# Patient Record
Sex: Male | Born: 1950 | Race: White | Hispanic: No | Smoking: Current every day smoker
Health system: Southern US, Community
[De-identification: ages and names within clinical notes are randomized; demographics above are authoritative.]

## PROBLEM LIST (undated history)

## (undated) DIAGNOSIS — E7800 Pure hypercholesterolemia, unspecified: Secondary | ICD-10-CM

## (undated) DIAGNOSIS — I4891 Unspecified atrial fibrillation: Secondary | ICD-10-CM

## (undated) DIAGNOSIS — I952 Hypotension due to drugs: Secondary | ICD-10-CM

## (undated) DIAGNOSIS — G4730 Sleep apnea, unspecified: Secondary | ICD-10-CM

## (undated) DIAGNOSIS — Z8673 Personal history of transient ischemic attack (TIA), and cerebral infarction without residual deficits: Secondary | ICD-10-CM

## (undated) DIAGNOSIS — J449 Chronic obstructive pulmonary disease, unspecified: Secondary | ICD-10-CM

## (undated) DIAGNOSIS — I110 Hypertensive heart disease with heart failure: Secondary | ICD-10-CM

## (undated) DIAGNOSIS — R7301 Impaired fasting glucose: Secondary | ICD-10-CM

## (undated) DIAGNOSIS — I5022 Chronic systolic (congestive) heart failure: Secondary | ICD-10-CM

## (undated) DIAGNOSIS — F32A Depression, unspecified: Secondary | ICD-10-CM

## (undated) DIAGNOSIS — Z45018 Encounter for adjustment and management of other part of cardiac pacemaker: Secondary | ICD-10-CM

## (undated) DIAGNOSIS — I951 Orthostatic hypotension: Secondary | ICD-10-CM

## (undated) DIAGNOSIS — I482 Chronic atrial fibrillation, unspecified: Secondary | ICD-10-CM

## (undated) DIAGNOSIS — I5032 Chronic diastolic (congestive) heart failure: Secondary | ICD-10-CM

## (undated) DIAGNOSIS — J439 Emphysema, unspecified: Secondary | ICD-10-CM

## (undated) DIAGNOSIS — I10 Essential (primary) hypertension: Secondary | ICD-10-CM

## (undated) DIAGNOSIS — R911 Solitary pulmonary nodule: Secondary | ICD-10-CM

## (undated) DIAGNOSIS — Z7901 Long term (current) use of anticoagulants: Secondary | ICD-10-CM

## (undated) DIAGNOSIS — I219 Acute myocardial infarction, unspecified: Secondary | ICD-10-CM

## (undated) DIAGNOSIS — I25119 Atherosclerotic heart disease of native coronary artery with unspecified angina pectoris: Secondary | ICD-10-CM

## (undated) DIAGNOSIS — F331 Major depressive disorder, recurrent, moderate: Secondary | ICD-10-CM

## (undated) DIAGNOSIS — Z950 Presence of cardiac pacemaker: Secondary | ICD-10-CM

## (undated) DIAGNOSIS — I509 Heart failure, unspecified: Secondary | ICD-10-CM

## (undated) DIAGNOSIS — G40109 Localization-related (focal) (partial) symptomatic epilepsy and epileptic syndromes with simple partial seizures, not intractable, without status epilepticus: Principal | ICD-10-CM

## (undated) DIAGNOSIS — I4819 Other persistent atrial fibrillation: Secondary | ICD-10-CM

## (undated) DIAGNOSIS — I639 Cerebral infarction, unspecified: Secondary | ICD-10-CM

## (undated) DIAGNOSIS — E785 Hyperlipidemia, unspecified: Secondary | ICD-10-CM

## (undated) DIAGNOSIS — F329 Major depressive disorder, single episode, unspecified: Secondary | ICD-10-CM

## (undated) HISTORY — DX: Hyperlipidemia, unspecified: E78.5

## (undated) HISTORY — DX: Orthostatic hypotension: I95.1

## (undated) HISTORY — DX: Chronic systolic (congestive) heart failure: I50.22

## (undated) HISTORY — DX: Major depressive disorder, single episode, unspecified: F32.9

## (undated) HISTORY — PX: PACEMAKER INSERTION: SHX728

## (undated) HISTORY — DX: Depression, unspecified: F32.A

## (undated) HISTORY — DX: Atherosclerotic heart disease of native coronary artery with unspecified angina pectoris: I25.119

## (undated) HISTORY — DX: Major depressive disorder, recurrent, moderate: F33.1

## (undated) HISTORY — DX: Sleep apnea, unspecified: G47.30

## (undated) HISTORY — DX: Emphysema, unspecified: J43.9

## (undated) HISTORY — DX: Chronic atrial fibrillation, unspecified: I48.20

## (undated) HISTORY — DX: Personal history of transient ischemic attack (TIA), and cerebral infarction without residual deficits: Z86.73

## (undated) HISTORY — DX: Hypertensive heart disease with heart failure: I11.0

## (undated) HISTORY — DX: Encounter for adjustment and management of other part of cardiac pacemaker: Z45.018

## (undated) HISTORY — DX: Localization-related (focal) (partial) symptomatic epilepsy and epileptic syndromes with simple partial seizures, not intractable, without status epilepticus: G40.109

## (undated) HISTORY — DX: Long term (current) use of anticoagulants: Z79.01

## (undated) HISTORY — DX: Pure hypercholesterolemia, unspecified: E78.00

## (undated) HISTORY — DX: Solitary pulmonary nodule: R91.1

## (undated) HISTORY — DX: Hypotension due to drugs: I95.2

## (undated) HISTORY — DX: Chronic diastolic (congestive) heart failure: I50.32

## (undated) HISTORY — DX: Essential (primary) hypertension: I10

## (undated) HISTORY — DX: Impaired fasting glucose: R73.01

## (undated) HISTORY — DX: Other persistent atrial fibrillation: I48.19

---

## 2016-04-17 DIAGNOSIS — Z7901 Long term (current) use of anticoagulants: Secondary | ICD-10-CM

## 2016-04-17 DIAGNOSIS — E785 Hyperlipidemia, unspecified: Secondary | ICD-10-CM

## 2016-04-17 DIAGNOSIS — I482 Chronic atrial fibrillation, unspecified: Secondary | ICD-10-CM | POA: Insufficient documentation

## 2016-04-17 DIAGNOSIS — I110 Hypertensive heart disease with heart failure: Secondary | ICD-10-CM

## 2016-04-17 DIAGNOSIS — I5032 Chronic diastolic (congestive) heart failure: Secondary | ICD-10-CM

## 2016-04-17 HISTORY — DX: Hypertensive heart disease with heart failure: I11.0

## 2016-04-17 HISTORY — DX: Hyperlipidemia, unspecified: E78.5

## 2016-04-17 HISTORY — DX: Long term (current) use of anticoagulants: Z79.01

## 2016-04-17 HISTORY — DX: Chronic diastolic (congestive) heart failure: I50.32

## 2016-04-17 HISTORY — DX: Chronic atrial fibrillation, unspecified: I48.20

## 2016-08-24 ENCOUNTER — Other Ambulatory Visit (HOSPITAL_BASED_OUTPATIENT_CLINIC_OR_DEPARTMENT_OTHER): Payer: Self-pay

## 2016-08-24 DIAGNOSIS — R0683 Snoring: Secondary | ICD-10-CM

## 2016-08-24 DIAGNOSIS — G4730 Sleep apnea, unspecified: Secondary | ICD-10-CM

## 2016-08-24 DIAGNOSIS — G4710 Hypersomnia, unspecified: Secondary | ICD-10-CM

## 2016-09-17 ENCOUNTER — Emergency Department (HOSPITAL_COMMUNITY): Payer: Medicaid Other

## 2016-09-17 ENCOUNTER — Encounter (HOSPITAL_COMMUNITY): Payer: Self-pay | Admitting: Emergency Medicine

## 2016-09-17 ENCOUNTER — Emergency Department (HOSPITAL_COMMUNITY)
Admission: EM | Admit: 2016-09-17 | Discharge: 2016-09-17 | Disposition: A | Discharge status: Home / Self Care | Payer: Medicaid Other | Attending: Physician Assistant | Admitting: Physician Assistant

## 2016-09-17 DIAGNOSIS — F17290 Nicotine dependence, other tobacco product, uncomplicated: Secondary | ICD-10-CM | POA: Insufficient documentation

## 2016-09-17 DIAGNOSIS — J449 Chronic obstructive pulmonary disease, unspecified: Secondary | ICD-10-CM | POA: Insufficient documentation

## 2016-09-17 DIAGNOSIS — I252 Old myocardial infarction: Secondary | ICD-10-CM | POA: Diagnosis not present

## 2016-09-17 DIAGNOSIS — I110 Hypertensive heart disease with heart failure: Secondary | ICD-10-CM | POA: Diagnosis not present

## 2016-09-17 DIAGNOSIS — Z8673 Personal history of transient ischemic attack (TIA), and cerebral infarction without residual deficits: Secondary | ICD-10-CM | POA: Diagnosis not present

## 2016-09-17 DIAGNOSIS — I509 Heart failure, unspecified: Secondary | ICD-10-CM | POA: Insufficient documentation

## 2016-09-17 DIAGNOSIS — Z950 Presence of cardiac pacemaker: Secondary | ICD-10-CM | POA: Diagnosis not present

## 2016-09-17 DIAGNOSIS — R5383 Other fatigue: Secondary | ICD-10-CM | POA: Insufficient documentation

## 2016-09-17 DIAGNOSIS — R42 Dizziness and giddiness: Secondary | ICD-10-CM | POA: Diagnosis not present

## 2016-09-17 DIAGNOSIS — R531 Weakness: Secondary | ICD-10-CM | POA: Diagnosis present

## 2016-09-17 HISTORY — DX: Essential (primary) hypertension: I10

## 2016-09-17 HISTORY — DX: Cerebral infarction, unspecified: I63.9

## 2016-09-17 HISTORY — DX: Chronic obstructive pulmonary disease, unspecified: J44.9

## 2016-09-17 HISTORY — DX: Presence of cardiac pacemaker: Z95.0

## 2016-09-17 HISTORY — DX: Heart failure, unspecified: I50.9

## 2016-09-17 HISTORY — DX: Acute myocardial infarction, unspecified: I21.9

## 2016-09-17 HISTORY — DX: Unspecified atrial fibrillation: I48.91

## 2016-09-17 LAB — BASIC METABOLIC PANEL
Anion gap: 12 (ref 5–15)
BUN: 22 mg/dL — AB (ref 6–20)
CALCIUM: 8.6 mg/dL — AB (ref 8.9–10.3)
CO2: 23 mmol/L (ref 22–32)
CREATININE: 1.15 mg/dL (ref 0.61–1.24)
Chloride: 104 mmol/L (ref 101–111)
GFR calc non Af Amer: >60 mL/min (ref >60)
Glucose, Bld: 70 mg/dL (ref 65–99)
Potassium: 3.9 mmol/L (ref 3.5–5.1)
SODIUM: 139 mmol/L (ref 135–145)

## 2016-09-17 LAB — URINALYSIS, ROUTINE W REFLEX MICROSCOPIC
Bilirubin Urine: NEGATIVE
Glucose, UA: NEGATIVE mg/dL
HGB URINE DIPSTICK: NEGATIVE
KETONES UR: NEGATIVE mg/dL
Leukocytes, UA: NEGATIVE
Nitrite: NEGATIVE
PROTEIN: NEGATIVE mg/dL
Specific Gravity, Urine: 1.008 (ref 1.005–1.030)
pH: 6 (ref 5.0–8.0)

## 2016-09-17 LAB — CBC
HCT: 40 % (ref 39.0–52.0)
Hemoglobin: 14.5 g/dL (ref 13.0–17.0)
MCH: 35.1 pg — AB (ref 26.0–34.0)
MCHC: 36.3 g/dL — ABNORMAL HIGH (ref 30.0–36.0)
MCV: 96.9 fL (ref 78.0–100.0)
PLATELETS: 195 10*3/uL (ref 150–400)
RBC: 4.13 MIL/uL — AB (ref 4.22–5.81)
RDW: 14.9 % (ref 11.5–15.5)
WBC: 9.1 10*3/uL (ref 4.0–10.5)

## 2016-09-17 LAB — BRAIN NATRIURETIC PEPTIDE: B Natriuretic Peptide: 220.5 pg/mL — ABNORMAL HIGH (ref 0.0–100.0)

## 2016-09-17 LAB — CBG MONITORING, ED: Glucose-Capillary: 87 mg/dL (ref 65–99)

## 2016-09-17 LAB — I-STAT TROPONIN, ED: TROPONIN I, POC: 0 ng/mL (ref 0.00–0.08)

## 2016-09-17 MED ORDER — IBUPROFEN 800 MG PO TABS
800.0000 mg | ORAL_TABLET | Freq: Once | ORAL | Status: AC
  Administered 2016-09-17: 800 mg via ORAL
  Filled 2016-09-17: qty 1

## 2016-09-17 NOTE — ED Triage Notes (Signed)
Pt c/o generalized symmetrical weakness, generalized blurred vision, and dizziness onset yesterday. Pt reports episodes onset in June, possibly earlier, where he gets weak in his legs, myoclonic jerks to arms and legs, worse on right side, pt unable to move during episodes. Neurologist states that episodes are "some sort of tremor," but not seizures. Episodes are occurring more frequently and with higher intensity. Last week pt's cardizem dose was decreased due to hypotension. Hx right side nerve damage s/t CVAs.

## 2016-09-17 NOTE — ED Notes (Signed)
Pt is aware that a urine sample is needed but is unable to provide one at this time 

## 2016-09-17 NOTE — ED Notes (Signed)
Daughter in law came out to nurse's desk stating that she wants documented in patient's chart that he has had opioid addiction in past and doesn't want any narcotic pain meds given.

## 2016-09-17 NOTE — Discharge Instructions (Signed)
Your labs are all very reassuring today. Your CAT scan shows an old stroke. This is consistent with your history. Your chest x-ray does not show any fluid on yhour lung. Please follow-up with your primary care physician, your cardiologist, and your neurologist.

## 2016-09-17 NOTE — ED Notes (Signed)
emt attempted to redraw lavender but was unable to collect anything. Nurse has been notified

## 2016-09-17 NOTE — ED Notes (Signed)
Patient requesting something for pain.  Patient states, "Tylenol doesn't work for me so needs to be something like Percocet".   Made Dr Thomasene Lot aware of DIL comment and patient's request.  Verbal orders for Ibuprofen '800mg'$  PO given.

## 2016-09-17 NOTE — ED Provider Notes (Signed)
[facility] DEPT Provider Note   CSN: [id number] Arrival date & time: 09/17/16  1027     History   Chief Complaint Chief Complaint  Patient presents with  . Dizziness  . Weakness    HPI Aaron Peterson [name]. is a 65 y.o. male.  HPI   She is a 65 year old male brought in by his family for "not feeling right". Patient and family have trouble lab reading. However they state that sometimes when he says he "doesn't feel well" it means that he is sick. Patient has no records here. He's been seen at [facility] multiple times for his A. fib, neurologic disease, CHF, kidney injury.   Family reports that he has strange "episodes". For which he seen by a neurologist. He reports they're more frequently than normal.  Patient's had strokelike symptoms in the past but unable to get an MRI secondary to pacemaker. He is also followed by a neurologist for this.  Patient is closely followed by a cardiologist for CHF and A. fib. Patient family says that he was having episodes of hypotension so he had his Cardizem decreased last week.  Patient's family reports that he felt like he was getting edema is greatly increase his Lasix, terazosin third day. They're concerned because sometimes in the increased Lasix he has trouble with his kidneys. We have no prior kidney function labs to compare to proceed All this care at [facility].   Patient is a poor historian and poor at giving symptoms. He may feel little lightheadedness may have blurry vision but is unsure when they started and can't describe it further.  Past Medical History:  Diagnosis Date  . A-fib ([place])   . CHF (congestive heart failure) ([place])   . COPD (chronic obstructive pulmonary disease) ([place])   . Hypertension   . Myocardial infarction   . Pacemaker   . Stroke [facility])     There are no active problems to display for this patient.   Past Surgical History:  Procedure Laterality Date  . PACEMAKER INSERTION         Home  Medications    Prior to Admission medications   Not on File    Family History History reviewed. No pertinent family history.  Social History Social History  Substance Use Topics  . Smoking status: Current Every Day Smoker    Types: E-cigarettes  . Smokeless tobacco: Former Systems developer  . Alcohol use No     Allergies   Penicillins   Review of Systems Review of Systems  Constitutional: Positive for fatigue. Negative for activity change.  Respiratory: Negative for shortness of breath.   Cardiovascular: Negative for chest pain.  Gastrointestinal: Negative for abdominal pain.  Neurological: Positive for weakness.  All other systems reviewed and are negative.    Physical Exam Updated Vital Signs BP 103/87   Pulse 99   Temp 97.9 F (36.6 C) (Oral)   Resp 19   Ht '5\' 5"'$  (1.651 m)   Wt 150 lb (68 kg)   SpO2 94%   BMI 24.96 kg/m   Physical Exam  Constitutional: He appears well-developed and well-nourished.  HENT:  Head: Normocephalic and atraumatic.  Eyes: Conjunctivae and EOM are normal. Pupils are equal, round, and reactive to light.  Neck: Neck supple.  Cardiovascular:  No murmur heard. A. fib  Pulmonary/Chest: Effort normal and breath sounds normal. No respiratory distress.  Abdominal: Soft. There is no tenderness.  Musculoskeletal: He exhibits no edema.  Neurological: He is alert. No cranial nerve deficit.  He exhibits normal muscle tone. Coordination normal.  She has baseline slurred speech, edentulous. Finger to nose intact. Cranial nerve II through XII appear relatively in tack.  Provider drift negative. Equal strength bilateral upper and lower extremities  Skin: Skin is warm and dry.  Psychiatric: He has a normal mood and affect.  Nursing note and vitals reviewed.    ED Treatments / Results  Labs (all labs ordered are listed, but only abnormal results are displayed) Labs Reviewed  BASIC METABOLIC PANEL - Abnormal; Notable for the following:       Result  Value   BUN 22 (*)    Calcium 8.6 (*)    All other components within normal limits  CBC - Abnormal; Notable for the following:    RBC 4.13 (*)    MCH 35.1 (*)    MCHC 36.3 (*)    All other components within normal limits  BRAIN NATRIURETIC PEPTIDE - Abnormal; Notable for the following:    B Natriuretic Peptide 220.5 (*)    All other components within normal limits  URINALYSIS, ROUTINE W REFLEX MICROSCOPIC  CBG MONITORING, ED  I-STAT TROPOININ, ED    EKG  EKG Interpretation  Date/Time:  Thursday September 17 2016 10:44:26 EST Ventricular Rate:  120 PR Interval:    QRS Duration: 99 QT Interval:  337 QTC Calculation: 498 R Axis:   79 Text Interpretation:  Sinus tachycardia with irregular rate Low voltage, precordial leads Borderline repolarization abnormality Borderline prolonged QT interval no evidence of acute ischemia, a fib Confirmed by Gerald Leitz ([id number]) on 09/17/2016 12:17:18 PM       Radiology No results found.  Procedures Procedures (including critical care time)  Medications Ordered in ED Medications  ibuprofen (ADVIL,MOTRIN) tablet 800 mg (800 mg Oral Given 09/17/16 1408)     Initial Impression / Assessment and Plan / ED Course  I have reviewed the triage vital signs and the nursing notes.  Pertinent labs & imaging results that were available during my care of the patient were reviewed by me and considered in my medical decision making (see chart for details).  Clinical Course     Patient is a chronically ill 65 year old male presenting with feeling "not well". This is in the context of recent increase in patient's Lasix for what the family perceives as increased edema. On exam patient is mildly clinically dry, A. fib, clear lungs, nonfocal neurologic exam. We will get screening labs, urine, chest x-ray, CT head. Patient complains of some possible blurry vision versus dizziness vs lightheadedness. Patient has pacemaker is unable to get MRI. We will get  CT here to make sure he doesn't have bleed. Otherwise patient has medically managed by outpatient neurologist for previous stroke-like it symptoms. Patient has nonfocal neurologic exam.  Patient recently moved in with these family members from previous family.  This family has been trying to adjust medications/get patient on the right track.  Has had a problem twice where they over diuresed causing AKI. That is what they are concenrned about today.  Patient appears well, and they think he is baseline.    Patient family member came and informed nursing he had previous issue with opioid addiction.  When family members left, patient complained on pain stating tylenol doesn't work, ibuprofen given.  Labs, vitals nad physical exam all appear baseline.  Will have him follow up with his PCP, neurologist and cardiolgist.  Patient is comfortable, ambulatory, and taking PO at time of discharge.  Patient expressed understanding about  return precautions.    Final Clinical Impressions(s) / ED Diagnoses   Final diagnoses:  Fatigue, unspecified type    New Prescriptions There are no discharge medications for this patient.    Alise Calais Julio Alm, MD 09/20/16 0177

## 2016-09-17 NOTE — ED Notes (Signed)
Patient transported to CT 

## 2016-10-15 ENCOUNTER — Ambulatory Visit (HOSPITAL_BASED_OUTPATIENT_CLINIC_OR_DEPARTMENT_OTHER): Payer: Medicaid Other | Attending: Physician Assistant | Admitting: Internal Medicine

## 2016-10-15 VITALS — Ht 66.0 in | Wt 150.0 lb

## 2016-10-15 DIAGNOSIS — G4733 Obstructive sleep apnea (adult) (pediatric): Secondary | ICD-10-CM | POA: Insufficient documentation

## 2016-10-15 DIAGNOSIS — G4736 Sleep related hypoventilation in conditions classified elsewhere: Secondary | ICD-10-CM | POA: Diagnosis not present

## 2016-10-15 DIAGNOSIS — I493 Ventricular premature depolarization: Secondary | ICD-10-CM | POA: Insufficient documentation

## 2016-10-15 DIAGNOSIS — G4730 Sleep apnea, unspecified: Secondary | ICD-10-CM

## 2016-10-15 DIAGNOSIS — I4891 Unspecified atrial fibrillation: Secondary | ICD-10-CM | POA: Diagnosis not present

## 2016-10-15 DIAGNOSIS — R0683 Snoring: Secondary | ICD-10-CM | POA: Diagnosis not present

## 2016-10-15 DIAGNOSIS — G4710 Hypersomnia, unspecified: Secondary | ICD-10-CM

## 2016-10-18 DIAGNOSIS — Z7901 Long term (current) use of anticoagulants: Secondary | ICD-10-CM

## 2016-10-18 DIAGNOSIS — I25119 Atherosclerotic heart disease of native coronary artery with unspecified angina pectoris: Secondary | ICD-10-CM

## 2016-10-18 DIAGNOSIS — F331 Major depressive disorder, recurrent, moderate: Secondary | ICD-10-CM

## 2016-10-18 DIAGNOSIS — I952 Hypotension due to drugs: Secondary | ICD-10-CM

## 2016-10-18 DIAGNOSIS — R7301 Impaired fasting glucose: Secondary | ICD-10-CM

## 2016-10-18 DIAGNOSIS — Z8673 Personal history of transient ischemic attack (TIA), and cerebral infarction without residual deficits: Secondary | ICD-10-CM

## 2016-10-18 DIAGNOSIS — R911 Solitary pulmonary nodule: Secondary | ICD-10-CM

## 2016-10-18 HISTORY — DX: Hypotension due to drugs: I95.2

## 2016-10-18 HISTORY — DX: Major depressive disorder, recurrent, moderate: F33.1

## 2016-10-18 HISTORY — DX: Long term (current) use of anticoagulants: Z79.01

## 2016-10-18 HISTORY — DX: Personal history of transient ischemic attack (TIA), and cerebral infarction without residual deficits: Z86.73

## 2016-10-18 HISTORY — DX: Impaired fasting glucose: R73.01

## 2016-10-18 HISTORY — DX: Atherosclerotic heart disease of native coronary artery with unspecified angina pectoris: I25.119

## 2016-10-18 HISTORY — DX: Solitary pulmonary nodule: R91.1

## 2016-10-24 DIAGNOSIS — G4730 Sleep apnea, unspecified: Secondary | ICD-10-CM

## 2016-10-24 NOTE — Procedures (Signed)
  Patient Name: Aaron Peterson, Aaron Peterson Date: 10/15/2016 Gender: Male D.O.B: 1951-07-05 Age (years): 65 Referring Provider: Cyndi Bender Height (inches): 46 Interpreting Physician: Baird Lyons MD, ABSM Weight (lbs): 150 RPSGT: Peak, Robert BMI: 24 MRN: [id number] Neck Size: 15.00 CLINICAL INFORMATION Sleep Study Type: NPSG  Indication for sleep study: Snoring  Epworth Sleepiness Score: 19  SLEEP STUDY TECHNIQUE As per the AASM Manual for the Scoring of Sleep and Associated Events v2.3 (April 2016) with a hypopnea requiring 4% desaturations.  The channels recorded and monitored were frontal, central and occipital EEG, electrooculogram (EOG), submentalis EMG (chin), nasal and oral airflow, thoracic and abdominal wall motion, anterior tibialis EMG, snore microphone, electrocardiogram, and pulse oximetry.  MEDICATIONS Medications self-administered by patient taken the night of the study : none reported  SLEEP ARCHITECTURE The study was initiated at 9:32:36 PM and ended at 4:49:20 AM.  Sleep onset time was 3.4 minutes and the sleep efficiency was 84.1%. The total sleep time was 367.5 minutes.  Stage REM latency was 141.5 minutes.  The patient spent 23.27% of the night in stage N1 sleep, 60.54% in stage N2 sleep, 0.00% in stage N3 and 16.19% in REM.  Alpha intrusion was absent.  Supine sleep was 35.10%.  RESPIRATORY PARAMETERS The overall apnea/hypopnea index (AHI) was 8.5 per hour. There were 22 total apneas, including 14 obstructive, 2 central and 6 mixed apneas. There were 30 hypopneas and 2 RERAs.  The AHI during Stage REM sleep was 0.0 per hour.  AHI while supine was 16.7 per hour.  The mean oxygen saturation was 86.56%. The minimum SpO2 during sleep was 74.00%.  Loud snoring was noted during this study.  CARDIAC DATA The 2 lead EKG demonstrated atrial fibrillation. The mean heart rate was 83.82 beats per minute. Other EKG findings include: PVCs.  LEG  MOVEMENT DATA The total PLMS were 11 with a resulting PLMS index of 1.80. Associated arousal with leg movement index was 0.2 .  IMPRESSIONS - Mild obstructive sleep apnea occurred during this study (AHI = 8.5/h). - No significant central sleep apnea occurred during this study (CAI = 0.3/h). - Significant oxygen desaturation was noted during this study (Min O2 = 74.00%, Mean saturation  86.6% ). - The patient snored with Loud snoring volume. - EKG findings include PVCs. - Clinically significant periodic limb movements did not occur during sleep. No significant associated arousals.  DIAGNOSIS - Obstructive Sleep Apnea (327.23 [G47.33 ICD-10]) - Nocturnal Hypoxemia (327.26 [G47.36 ICD-10])  RECOMMENDATIONS - Consider CPAP or a fitted oral appliance to treat obstructive sleep apnea. - The mean oxygen saturation was lower than anticipated for this degree of OSA, suggesting underlying cardiopulmonary disease which may need to be addressed. - Positional therapy avoiding supine position during sleep. - Avoid alcohol, sedatives and other CNS depressants that may worsen sleep apnea and disrupt normal sleep architecture. - Sleep hygiene should be reviewed to assess factors that may improve sleep quality. - Weight management and regular exercise should be initiated or continued if appropriate.  [Electronically signed] 10/24/2016 11:47 AM  Baird Lyons MD, ABSM Diplomate, American Board of Sleep Medicine   NPI: [id number]  [place], American Board of Sleep Medicine  ELECTRONICALLY SIGNED ON:  10/24/2016, 11:42 AM [place] PH: [phone]   FX: [phone] [place]

## 2016-11-12 ENCOUNTER — Other Ambulatory Visit (HOSPITAL_BASED_OUTPATIENT_CLINIC_OR_DEPARTMENT_OTHER): Payer: Self-pay

## 2016-11-12 DIAGNOSIS — G4710 Hypersomnia, unspecified: Secondary | ICD-10-CM

## 2016-11-12 DIAGNOSIS — R0683 Snoring: Secondary | ICD-10-CM

## 2016-11-12 DIAGNOSIS — G4730 Sleep apnea, unspecified: Secondary | ICD-10-CM

## 2016-12-02 ENCOUNTER — Encounter: Payer: Self-pay | Admitting: Sports Medicine

## 2016-12-02 ENCOUNTER — Ambulatory Visit (INDEPENDENT_AMBULATORY_CARE_PROVIDER_SITE_OTHER): Payer: Medicaid Other | Admitting: Sports Medicine

## 2016-12-02 DIAGNOSIS — I5022 Chronic systolic (congestive) heart failure: Secondary | ICD-10-CM

## 2016-12-02 DIAGNOSIS — B351 Tinea unguium: Secondary | ICD-10-CM

## 2016-12-02 DIAGNOSIS — M79675 Pain in left toe(s): Secondary | ICD-10-CM

## 2016-12-02 DIAGNOSIS — J439 Emphysema, unspecified: Secondary | ICD-10-CM

## 2016-12-02 DIAGNOSIS — M79674 Pain in right toe(s): Secondary | ICD-10-CM | POA: Diagnosis not present

## 2016-12-02 DIAGNOSIS — I10 Essential (primary) hypertension: Secondary | ICD-10-CM

## 2016-12-02 DIAGNOSIS — E7800 Pure hypercholesterolemia, unspecified: Secondary | ICD-10-CM | POA: Insufficient documentation

## 2016-12-02 DIAGNOSIS — I4819 Other persistent atrial fibrillation: Secondary | ICD-10-CM | POA: Insufficient documentation

## 2016-12-02 DIAGNOSIS — Z950 Presence of cardiac pacemaker: Secondary | ICD-10-CM | POA: Insufficient documentation

## 2016-12-02 HISTORY — DX: Essential (primary) hypertension: I10

## 2016-12-02 HISTORY — DX: Chronic systolic (congestive) heart failure: I50.22

## 2016-12-02 HISTORY — DX: Other persistent atrial fibrillation: I48.19

## 2016-12-02 HISTORY — DX: Emphysema, unspecified: J43.9

## 2016-12-02 NOTE — Progress Notes (Signed)
Subjective: Aaron Peterson. is a 66 y.o. male patient seen today in office with complaint of painful thickened and elongated toenails; unable to trim. Patient denies history of Diabetes, Neuropathy, or Vascular disease. On Oxygen for Emphysema and COPD. Patient has no other pedal complaints at this time.   Patient Active Problem List   Diagnosis Date Noted  . Chronic systolic congestive heart failure ([place]) 12/02/2016  . Emphysema lung ([place]) 12/02/2016  . Essential hypertension 12/02/2016  . Hypercholesterolemia 12/02/2016  . Pacemaker 12/02/2016  . Persistent atrial fibrillation ([place]) 12/02/2016  . Coronary artery disease involving native coronary artery of native heart with angina pectoris ([place]) 10/18/2016  . Current use of long term anticoagulation 10/18/2016  . Hx of completed stroke 10/18/2016  . Hypotension due to drugs 10/18/2016  . IFG (impaired fasting glucose) 10/18/2016  . Moderate episode of recurrent major depressive disorder ([place]) 10/18/2016  . Pulmonary nodule, left 10/18/2016  . Chronic anticoagulation 04/17/2016  . Chronic atrial fibrillation ([place]) 04/17/2016  . Chronic diastolic heart failure ([place]) 04/17/2016  . Hyperlipidemia 04/17/2016  . Hypertensive heart disease with heart failure ([place]) 04/17/2016    No current outpatient prescriptions on file prior to visit.   No current facility-administered medications on file prior to visit.     Allergies  Allergen Reactions  . Penicillins Swelling    Oral edema    Objective: Physical Exam  General: Well developed, nourished, no acute distress, awake, alert and oriented x 3  Vascular: Dorsalis pedis artery 1/4 bilateral, Posterior tibial artery 1/4 bilateral, skin temperature warm to warm proximal to distal bilateral lower extremities, mild varicosities, scant pedal hair present bilateral. Trace edema bilateral.   Neurological: Gross sensation present via light touch bilateral.   Dermatological: Skin is  warm, dry, and supple bilateral, Nails 1-10 are tender, long, thick, and discolored with mild subungal debris with left 1st toenail most involved, no webspace macerations present bilateral, no open lesions present bilateral, no callus/corns/hyperkeratotic tissue present bilateral. No signs of infection bilateral.  Musculoskeletal: No symptomatic boney deformities noted bilateral. Muscular strength within normal limits without painon range of motion. No pain with calf compression bilateral.  Assessment and Plan:  Problem List Items Addressed This Visit      Respiratory   Emphysema lung (HCC)   Relevant Medications   fluticasone-salmeterol (ADVAIR HFA) 230-21 MCG/ACT inhaler   albuterol (PROVENTIL) (2.5 MG/3ML) 0.083% nebulizer solution   Albuterol Sulfate (PROAIR HFA IN)   fluticasone-salmeterol (ADVAIR HFA) 115-21 MCG/ACT inhaler   ipratropium-albuterol (DUONEB) 0.5-2.5 (3) MG/3ML SOLN    Other Visit Diagnoses    Dermatophytosis of nail    -  Primary   Toe pain, bilateral          -Examined patient.  -Discussed treatment options for painful mycotic nails. -Mechanically debrided and reduced mycotic nails with sterile nail nipper and dremel nail file without incident. -Patient to return in 3 months for follow up evaluation or sooner if symptoms worsen.  Landis Martins, DPM

## 2016-12-09 ENCOUNTER — Ambulatory Visit (HOSPITAL_BASED_OUTPATIENT_CLINIC_OR_DEPARTMENT_OTHER): Payer: Medicaid Other | Attending: Physician Assistant | Admitting: Internal Medicine

## 2016-12-09 DIAGNOSIS — I4891 Unspecified atrial fibrillation: Secondary | ICD-10-CM | POA: Insufficient documentation

## 2016-12-09 DIAGNOSIS — G4710 Hypersomnia, unspecified: Secondary | ICD-10-CM | POA: Diagnosis not present

## 2016-12-09 DIAGNOSIS — R0683 Snoring: Secondary | ICD-10-CM | POA: Diagnosis not present

## 2016-12-09 DIAGNOSIS — G4733 Obstructive sleep apnea (adult) (pediatric): Secondary | ICD-10-CM | POA: Insufficient documentation

## 2016-12-09 DIAGNOSIS — G4730 Sleep apnea, unspecified: Secondary | ICD-10-CM

## 2016-12-13 DIAGNOSIS — G4730 Sleep apnea, unspecified: Secondary | ICD-10-CM | POA: Diagnosis not present

## 2016-12-13 NOTE — Procedures (Signed)
Patient Name: Aaron Peterson, Rockett Date: 12/09/2016 Gender: Male D.O.B: [birth year] Age (years): 65 Referring Provider: Cyndi Bender Height (inches): 9 Interpreting Physician: Baird Lyons MD, ABSM Weight (lbs): 160 RPSGT: Carolin Coy BMI: 26 MRN: [id number] Neck Size: 15.50 CLINICAL INFORMATION The patient is referred for a split night study with BPAP. Most recent polysomnogram dated 10/15/2016 revealed an AHI of 8.5/h and RDI of 8.8/h. Desaturation to 86%, body weight 150 lbs  MEDICATIONS Medications self-administered by patient taken the night of the study : none reported  SLEEP STUDY TECHNIQUE As per the AASM Manual for the Scoring of Sleep and Associated Events v2.3 (April 2016) with a hypopnea requiring 4% desaturations.  The channels recorded and monitored were frontal, central and occipital EEG, electrooculogram (EOG), submentalis EMG (chin), nasal and oral airflow, thoracic and abdominal wall motion, anterior tibialis EMG, snore microphone, electrocardiogram, and pulse oximetry. Bi-level positive airway pressure (BiPAP) was initiated when the patient met split night criteria and was titrated according to treat sleep-disordered breathing.  RESPIRATORY PARAMETERS Diagnostic  Total AHI (/hr): 10.6 RDI (/hr): 12.5 OA Index (/hr): 0.7 CA Index (/hr): 0.3 REM AHI (/hr): 1.2 NREM AHI (/hr): 11.9 Supine AHI (/hr): 12.9 Non-supine AHI (/hr): 9.73 Min O2 Sat (%): 79.00 Mean O2 (%): 89.36 Time below 88% (min): 92.9   Titration  Optimal IPAP Pressure (cm): 22 Optimal EPAP Pressure (cm): 17 AHI at Optimal Pressure (/hr): 3.4 Min O2 at Optimal Pressure (%): 89.0 Sleep % at Optimal (%): 82 Supine % at Optimal (%): 1      SLEEP ARCHITECTURE The study was initiated at 9:39:07 PM and terminated at 4:35:19 AM. The total recorded time was 416.2 minutes. EEG confirmed total sleep time was 403.0 minutes yielding a sleep efficiency of 96.8%. Sleep onset after lights out was 0.5  minutes with a REM latency of 191.5 minutes. The patient spent 20.47% of the night in stage N1 sleep, 67.00% in stage N2 sleep, 0.00% in stage N3 and 12.53% in REM. Wake after sleep onset (WASO) was 12.7 minutes. The Arousal Index was 13.8/hour.  LEG MOVEMENT DATA The total Periodic Limb Movements of Sleep (PLMS) were 4. The PLMS index was 0.60 .  CARDIAC DATA The 2 lead EKG demonstrated atrial fibrillation. The mean heart rate was 100.18 beats per minute. Other EKG findings include: None.  IMPRESSIONS - Mild obstructive sleep apnea occurred during the diagnostic portion of the study (AHI = 10.6 /hour). - CPAP titration provided inadequate control. CPAP 18 controlled apneas. An optimal BiPAP pressure was selected for this patient ( 22 / 17cm of water) to control residual snoring. - No significant central sleep apnea occurred during the diagnostic portion of the study (CAI = 0.3/hour). - The patient snored with Moderate snoring volume during the diagnostic portion of the study. - No cardiac abnormalities were noted during this study. - Clinically significant periodic limb movements of sleep did not occur during the study.  DIAGNOSIS - Obstructive Sleep Apnea (327.23 [G47.33 ICD-10])  RECOMMENDATIONS - Trial of BiPAP therapy on 22/17 cm H2O with a Medium size Resmed Full Face Mask Mirage Quattro mask and heated humidification. - Avoid alcohol, sedatives and other CNS depressants that may worsen sleep apnea and disrupt normal sleep architecture. - Sleep hygiene should be reviewed to assess factors that may improve sleep quality. - Weight management and regular exercise should be initiated or continued.  [Electronically signed] 12/13/2016 11:24 AM  Baird Lyons MD, [place], American Board of Sleep Medicine   NPI: [id number]  Deneise Lever Diplomate, American Board of Sleep Medicine  ELECTRONICALLY SIGNED ON:  12/13/2016, 11:18 AM [place] PH: [phone]  [phone]   FX: [phone] [place]

## 2017-01-14 DIAGNOSIS — Z45018 Encounter for adjustment and management of other part of cardiac pacemaker: Secondary | ICD-10-CM

## 2017-01-14 HISTORY — DX: Encounter for adjustment and management of other part of cardiac pacemaker: Z45.018

## 2017-03-03 ENCOUNTER — Ambulatory Visit: Payer: Medicaid Other | Admitting: Sports Medicine

## 2017-03-03 DIAGNOSIS — B351 Tinea unguium: Secondary | ICD-10-CM

## 2017-03-03 DIAGNOSIS — M79676 Pain in unspecified toe(s): Secondary | ICD-10-CM | POA: Diagnosis not present

## 2017-03-03 DIAGNOSIS — M79674 Pain in right toe(s): Secondary | ICD-10-CM

## 2017-03-03 DIAGNOSIS — M79675 Pain in left toe(s): Secondary | ICD-10-CM

## 2017-03-03 DIAGNOSIS — I739 Peripheral vascular disease, unspecified: Secondary | ICD-10-CM

## 2017-03-03 NOTE — Progress Notes (Signed)
Subjective: Aaron Peterson. is a 66 y.o. male patient seen today in office with complaint of painful thickened and elongated toenails; unable to trim. Patient denies history of Diabetes, Neuropathy, or Vascular disease. On Oxygen for Emphysema and COPD however patient reports that he left his O2 at home today. Patient has no other pedal complaints at this time.   Patient Active Problem List   Diagnosis Date Noted  . Chronic systolic congestive heart failure ([place]) 12/02/2016  . Emphysema lung ([place]) 12/02/2016  . Essential hypertension 12/02/2016  . Hypercholesterolemia 12/02/2016  . Pacemaker 12/02/2016  . Persistent atrial fibrillation ([place]) 12/02/2016  . Coronary artery disease involving native coronary artery of native heart with angina pectoris ([place]) 10/18/2016  . Current use of long term anticoagulation 10/18/2016  . Hx of completed stroke 10/18/2016  . Hypotension due to drugs 10/18/2016  . IFG (impaired fasting glucose) 10/18/2016  . Moderate episode of recurrent major depressive disorder ([place]) 10/18/2016  . Pulmonary nodule, left 10/18/2016  . Chronic anticoagulation 04/17/2016  . Chronic atrial fibrillation ([place]) 04/17/2016  . Chronic diastolic heart failure ([place]) 04/17/2016  . Hyperlipidemia 04/17/2016  . Hypertensive heart disease with heart failure ([place]) 04/17/2016    Current Outpatient Prescriptions on File Prior to Visit  Medication Sig Dispense Refill  . albuterol (PROVENTIL) (2.5 MG/3ML) 0.083% nebulizer solution 2.5 mg.    . Albuterol Sulfate (PROAIR HFA IN) Inhale into the lungs.    Marland Kitchen diltiazem (CARDIZEM) 60 MG tablet Take 60 mg by mouth.    . fluticasone-salmeterol (ADVAIR HFA) 115-21 MCG/ACT inhaler Inhale into the lungs.    . fluticasone-salmeterol (ADVAIR HFA) 230-21 MCG/ACT inhaler INHALE 2 PUFFS BY MOUTH TWICE A DAY RINSE MOUTH AND THOART AFTER USE    . furosemide (LASIX) 20 MG tablet Take 60 mg by mouth.    Marland Kitchen ipratropium-albuterol (DUONEB) 0.5-2.5 (3)  MG/3ML SOLN Take 1 ampule by nebulization every 6 (six) hours as needed.    Marland Kitchen lisinopril (PRINIVIL,ZESTRIL) 5 MG tablet Take 5 mg by mouth.    . metoprolol tartrate (LOPRESSOR) 25 MG tablet Take 25 mg by mouth.    . mirtazapine (REMERON) 15 MG tablet Take 22.5 mg by mouth.    . nitroGLYCERIN (NITROSTAT) 0.4 MG SL tablet Place under the tongue.    . pravastatin (PRAVACHOL) 40 MG tablet Take 40 mg by mouth.    . pregabalin (LYRICA) 200 MG capsule Take by mouth.    . Rivaroxaban (XARELTO) 15 MG TABS tablet Take 15 mg by mouth.    . Venlafaxine HCl 75 MG TB24 Take by mouth.    . venlafaxine XR (EFFEXOR-XR) 75 MG 24 hr capsule TAKE ONE CAPSULE BY MOUTH EVERY NIGHT AT BEDTIME    . levETIRAcetam (KEPPRA) 1000 MG tablet Take by mouth.    . pregabalin (LYRICA) 100 MG capsule Take 150 mg by mouth.     No current facility-administered medications on file prior to visit.     Allergies  Allergen Reactions  . Penicillins Swelling    Oral edema    Objective: Physical Exam  General: Well developed, nourished, no acute distress, awake, alert and oriented x 3  Vascular: Dorsalis pedis artery 1/4 bilateral, Posterior tibial artery 1/4 bilateral, skin temperature warm to warm proximal to distal bilateral lower extremities, mild varicosities, scant pedal hair present bilateral. Trace edema bilateral.   Neurological: Gross sensation present via light touch bilateral.   Dermatological: Skin is warm, dry, and supple bilateral, Nails 1-10 are tender, long, thick, and  discolored with mild subungal debris with left 1st toenail most involved, no webspace macerations present bilateral, no open lesions present bilateral, no callus/corns/hyperkeratotic tissue present bilateral. No signs of infection bilateral.  Musculoskeletal: No symptomatic boney deformities noted bilateral. Muscular strength within normal limits without painon range of motion. No pain with calf compression bilateral.  Assessment and Plan:   Problem List Items Addressed This Visit    None    Visit Diagnoses    Dermatophytosis of nail    -  Primary   Toe pain, bilateral       PVD (peripheral vascular disease) (HCC)       Relevant Medications   diltiazem (CARDIZEM CD) 180 MG 24 hr capsule      -Examined patient.  -Discussed treatment options for painful mycotic nails. -Mechanically debrided and reduced mycotic nails with sterile nail nipper and dremel nail file without incident. -Encouraged continued elevation when sitting to assist with edema control and lower extremities -Patient to return in 3 months for follow up evaluation or sooner if symptoms worsen.  Aaron Peterson, DPM

## 2017-04-26 DIAGNOSIS — I5032 Chronic diastolic (congestive) heart failure: Secondary | ICD-10-CM | POA: Diagnosis not present

## 2017-04-26 DIAGNOSIS — R918 Other nonspecific abnormal finding of lung field: Secondary | ICD-10-CM | POA: Diagnosis not present

## 2017-04-26 DIAGNOSIS — R569 Unspecified convulsions: Secondary | ICD-10-CM | POA: Diagnosis not present

## 2017-04-26 DIAGNOSIS — I482 Chronic atrial fibrillation: Secondary | ICD-10-CM | POA: Diagnosis not present

## 2017-04-26 DIAGNOSIS — J449 Chronic obstructive pulmonary disease, unspecified: Secondary | ICD-10-CM | POA: Diagnosis not present

## 2017-04-27 DIAGNOSIS — R42 Dizziness and giddiness: Secondary | ICD-10-CM

## 2017-04-27 DIAGNOSIS — R918 Other nonspecific abnormal finding of lung field: Secondary | ICD-10-CM | POA: Diagnosis not present

## 2017-04-27 DIAGNOSIS — I482 Chronic atrial fibrillation: Secondary | ICD-10-CM | POA: Diagnosis not present

## 2017-04-27 DIAGNOSIS — I5032 Chronic diastolic (congestive) heart failure: Secondary | ICD-10-CM | POA: Diagnosis not present

## 2017-04-27 DIAGNOSIS — J449 Chronic obstructive pulmonary disease, unspecified: Secondary | ICD-10-CM | POA: Diagnosis not present

## 2017-04-27 DIAGNOSIS — R569 Unspecified convulsions: Secondary | ICD-10-CM | POA: Diagnosis not present

## 2017-05-09 DIAGNOSIS — I9589 Other hypotension: Secondary | ICD-10-CM | POA: Diagnosis not present

## 2017-05-10 DIAGNOSIS — I959 Hypotension, unspecified: Secondary | ICD-10-CM | POA: Diagnosis not present

## 2017-05-10 DIAGNOSIS — I9589 Other hypotension: Secondary | ICD-10-CM | POA: Diagnosis not present

## 2017-05-17 ENCOUNTER — Ambulatory Visit (INDEPENDENT_AMBULATORY_CARE_PROVIDER_SITE_OTHER): Payer: Medicaid Other | Admitting: Neurology

## 2017-05-17 ENCOUNTER — Encounter: Payer: Self-pay | Admitting: Neurology

## 2017-05-17 DIAGNOSIS — G40109 Localization-related (focal) (partial) symptomatic epilepsy and epileptic syndromes with simple partial seizures, not intractable, without status epilepticus: Secondary | ICD-10-CM | POA: Insufficient documentation

## 2017-05-17 HISTORY — DX: Localization-related (focal) (partial) symptomatic epilepsy and epileptic syndromes with simple partial seizures, not intractable, without status epilepticus: G40.109

## 2017-05-17 MED ORDER — OXCARBAZEPINE 150 MG PO TABS: ORAL_TABLET | ORAL | 3 refills | Status: DC

## 2017-05-17 NOTE — Patient Instructions (Signed)
   We will start Trileptal for the seizures.   Trileptal (oxcarbazepine) is a seizure medication that is sometimes also used for nerve related pain. As with any seizure medication, this drug may worsen depression. Occasionally, there may be other side effects that include low sodium levels, drowsiness, incoordination, dizziness, headache, nausea, double vision, or a potential allergy involving a skin rash. If you believe that you are having side effects on this medication, please contact our office.

## 2017-05-17 NOTE — Progress Notes (Signed)
Reason for visit: Seizures  Referring physician: Dr. Meda Coffee Mcelwee [name]. is a 66 y.o. male  History of present illness:  Mr. Aaron Peterson is a 66 year old right-handed white male with a history of cerebrovascular disease and cardiovascular disease and COPD. The patient in the past has suffered a myocardial infarction and had 2 strokes. The patient comes in with the daughter, she believes that the strokes occurred about 8 or 9 years ago. 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 The patient has been living with the daughter for over a year, over that period of time she has noted that her father has had episodes of tremors or jerking involving the right arm. These episodes have become more pronounced and may occur several times a day, or he may go 2 months without any events. The events are associated with elevation of the arm slightly with flexion at the elbow and then jerking of the arm that occurs lasting 30 seconds to up to 2 minutes with resolution. During the events, the patient is unable to speak, but he does not lose consciousness. It is not clear that there is any alteration in sensation during the events. The daughter has not noted any jerking on the face, at times the patient may have some or twitching of the legs, this may occur on either side. The patient has had a recent hospitalization at [facility], he has had an increase in his Keppra dosing taking 1500 mg twice daily, and an EEG study in the past apparently was unremarkable. The patient is on Xarelto currently. He also takes Lyrica for a peripheral neuropathy. He is on oxygen at home. He is sent to this office for an evaluation.  Past Medical History:  Diagnosis Date  . A-fib ([place])   . CHF (congestive heart failure) ([place])   . COPD (chronic obstructive pulmonary disease) ([place])   . Depression   . High cholesterol   . Hypertension   . Myocardial infarction ([place])   . Pacemaker    . Sleep apnea   . Stroke [facility])     Past Surgical History:  Procedure Laterality Date  . PACEMAKER INSERTION      History reviewed. No pertinent family history.  Social history:  reports that he has been smoking E-cigarettes.  He has quit using smokeless tobacco. He reports that he does not drink alcohol or use drugs.  Medications:  Prior to Admission medications   Medication Sig Start Date End Date Taking? Authorizing Provider  albuterol (PROVENTIL) (2.5 MG/3ML) 0.083% nebulizer solution Take 2.5 mg by nebulization as needed.    Yes [provider]  Albuterol Sulfate (PROAIR HFA IN) Inhale into the lungs.   Yes [provider]  fluticasone-salmeterol (ADVAIR HFA) 115-21 MCG/ACT inhaler Inhale into the lungs.   Yes [provider]  fluticasone-salmeterol (ADVAIR HFA) 230-21 MCG/ACT inhaler INHALE 2 PUFFS BY MOUTH TWICE A DAY RINSE MOUTH AND THOART AFTER USE 01/16/16  Yes [provider]  furosemide (LASIX) 20 MG tablet Take 40 mg by mouth 2 (two) times daily.  11/10/16  Yes [provider]  ipratropium-albuterol (DUONEB) 0.5-2.5 (3) MG/3ML SOLN Take 1 ampule by nebulization every 6 (six) hours as needed.   Yes [provider]  levETIRAcetam (KEPPRA) 1000 MG tablet Take 1,500 mg by mouth 2 (two) times daily.    Yes [provider]  lisinopril (PRINIVIL,ZESTRIL) 5 MG tablet Take 5 mg by mouth. 04/10/16  Yes [provider]  metoprolol tartrate (LOPRESSOR) 25 MG tablet Take 25 mg by mouth 2 (two) times daily.    Yes [provider]  mirtazapine (REMERON) 15 MG tablet Take 22.5 mg by mouth. 04/10/16  Yes [provider]  nitroGLYCERIN (NITROSTAT) 0.4 MG SL tablet Place under the tongue.   Yes [provider]  pravastatin (PRAVACHOL) 40 MG tablet Take 40 mg by mouth.   Yes [provider]  pregabalin (LYRICA) 200 MG capsule Take 200 mg by mouth 2 (two) times daily.  10/20/16 10/20/17 Yes  [provider]  Rivaroxaban (XARELTO) 15 MG TABS tablet Take 15 mg by mouth.   Yes [provider]  Venlafaxine HCl 75 MG TB24 Take 75 mg by mouth every morning.    Yes [provider]  diltiazem (CARDIZEM CD) 180 MG 24 hr capsule Take 180 mg by mouth. 01/14/17   [provider]  diltiazem (CARDIZEM) 60 MG tablet Take 60 mg by mouth.    [provider]  OXcarbazepine (TRILEPTAL) 150 MG tablet One tablet twice a day for 2 weeks, then take 2 tablets twice a day 05/17/17   Kathrynn Ducking, MD      Allergies  Allergen Reactions  . Penicillins Swelling    Oral edema    ROS:  Out of a complete 14 system review of symptoms, the patient complains only of the following symptoms, and all other reviewed systems are negative.  Fatigue Swelling in the legs Moles Blurred vision Shortness of breath, cough, snoring Increased thirst Achy muscles Memory loss, confusion, numbness, weakness, slurred speech, dizziness, seizure, tremor Depression, too much sleep, decreased energy, disinterest in activities Insomnia, sleepiness, restless legs  Blood pressure 100/70, pulse 78, height 5\' 6"  (1.676 m), weight 172 lb 8 oz (78.2 kg), SpO2 96 %.  Physical Exam  General: The patient is alert and cooperative at the time of the examination.  Eyes: Pupils are equal, round, and reactive to light. Discs are flat bilaterally.  Neck: The neck is supple, no carotid bruits are noted.  Respiratory: The respiratory examination is clear.  Cardiovascular: The cardiovascular examination reveals a regular rate and rhythm, no obvious murmurs or rubs are noted.  Skin: Extremities are with 1+ edema at the ankles bilaterally.  Neurologic Exam  Mental status: The patient is alert and oriented x 3 at the time of the examination. The patient has apparent normal recent and remote memory, with an apparently normal attention span and concentration ability.  Cranial nerves:  Facial symmetry is not present. There is depression of the right nasolabial fold There is good sensation of the face to pinprick and soft touch on the left face, decreased on the right. The strength of the facial muscles and the muscles to head turning and shoulder shrug are normal bilaterally. Speech is well enunciated, no aphasia or dysarthria is noted. Extraocular movements are full. Visual fields are full, with exception of a right superior quadrantanopsia. The tongue is midline, and the patient has symmetric elevation of the soft palate. No obvious hearing deficits are noted.  Motor: The motor testing reveals 5 over 5 strength of all 4 extremities. Good symmetric motor tone is noted throughout.  Sensory: Sensory testing is intact to pinprick, soft touch, vibration sensation, and position sense on the left extremities. There is decreased pinprick, soft touch, vibration and position sense on the right arm and right leg, extinction is seen on the right.  Coordination: Cerebellar testing reveals good finger-nose-finger and heel-to-shin bilaterally.  Gait and station: Gait is slightly wide-based, the patient can walk independent. Tandem gait is minimally unsteady. Romberg is negative.  Reflexes: Deep tendon reflexes are symmetric, but are decreased bilaterally. Toes are downgoing bilaterally.   Assessment/Plan:  1. History of left parietal stroke  2. 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 The patient is on Keppra taking 1500 mg twice daily, we will add Trileptal to this in low dose and gradually build up the dose. He will be seen back in 2 months, we will need to check blood work at that time, if the patient begins to feel poorly on the medication, blood work will be done earlier to exclude hyponatremia. The patient does not operate a motor vehicle. Focal seizures such as this may be very difficult to  control and may require multiple medications. The Trileptal does not interact with Xarelto.   Jill Alexanders MD 05/17/2017 8:53 AM  [facility] [address] [place], [place] [address]  Phone [phone] Fax [phone]

## 2017-05-19 NOTE — Progress Notes (Signed)
Cardiology Office Note:    Date:  05/20/2017   ID:  Aaron Peterson., DOB [birth year], MRN [id number]  PCP:  Cyndi Bender, PA-C  Cardiologist:  Shirlee More, MD    Referring MD: Cyndi Bender, PA-C    ASSESSMENT:    1. Chronic diastolic heart failure ([place])   2. Chronic atrial fibrillation (HCC)   3. Coronary artery disease involving native coronary artery of native heart with angina pectoris ([place])   4. Hypertensive heart disease with heart failure (HCC)   5. Persistent atrial fibrillation ([place])   6. Chronic anticoagulation   7. Pacemaker   8. 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 Stable compensated continue current weight-based diuretic sodium restriction and close supervision by his daughter. 2. Stable rate controlled no longer on a calcium channel blocker continue his beta blocker and anticoagulant 3. Stable having no anginal discomfort continue beta blocker and statin 4. Stable, I've asked his daughter that if his systolics less than his ACE inhibitor. 5. Stable continue his anticoagulant 6. Stable he has device interrogated during recent hospitalizations 7. Stable   Next appointment: 6 months   Medication Adjustments/Labs and Tests Ordered: Current medicines are reviewed at length with the patient today.  Concerns regarding medicines are outlined above.  No orders of the defined types were placed in this encounter.  No orders of the defined types were placed in this encounter.   Chief Complaint  Patient presents with  . Follow-up    flup to discuss swelling, SHOB, and dizziness  . Congestive Heart Failure  . Atrial Fibrillation    History of Present Illness:    Aaron Peterson. is a 66 y.o. male with a hx of COPD, CAD,hyperlipidemia, diastolic CHF, Chronic Atrial Fibrillation on anticoagulant, pacemaker and hypertensive heart disease  last seen 3 months ago.He had a recent [facility]  admission with a focal seizure transient hypotension. His diuretics were discontinued he developed decompensated heart failure his daughter placed back on his diuretic and he is at his baseline weight and usual shortness of breath due to COPD. He's had no edema orthopnea chest pain palpitation or syncope. He has a highly suspicious CT scan for lung cancer and is awaiting PET scan. His comorbidities and make a very poor candidate for surgical treatment. He's had no bleeding complication from anticoagulant. I reviewed his [place] records during the office visit labs were stable during CBC CMP troponin was not detectable and EKG showed rate controlled atrial fibrillation Compliance with diet, lifestyle and medications: Yes Past Medical History:  Diagnosis Date  . A-fib ([place])   . CHF (congestive heart failure) ([place])   . Chronic anticoagulation 04/17/2016  . Chronic atrial fibrillation ([place]) 04/17/2016  . Chronic diastolic heart failure ([place]) 04/17/2016  . Chronic systolic congestive heart failure ([place]) 12/02/2016  . COPD (chronic obstructive pulmonary disease) ([place])   . Coronary artery disease involving native coronary artery of native heart with angina pectoris ([place]) 10/18/2016  . Current use of long term anticoagulation 10/18/2016  . Depression   . Emphysema lung ([place]) 12/02/2016  . Essential hypertension 12/02/2016  . 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 High cholesterol   . Hx of completed stroke 10/18/2016  . Hyperlipidemia 04/17/2016  . Hypertension   . Hypertensive heart disease with heart failure ([place]) 04/17/2016  . Hypotension due to drugs 10/18/2016  . IFG (impaired fasting glucose) 10/18/2016  .  Moderate episode of recurrent major depressive disorder ([place]) 10/18/2016  . Myocardial infarction ([place])   . Pacemaker   . Pacemaker reprogramming/check 01/14/2017  . Persistent atrial fibrillation ([place]) 12/02/2016  . Pulmonary nodule, left 10/18/2016  . Sleep apnea   . Stroke [facility])      Past Surgical History:  Procedure Laterality Date  . PACEMAKER INSERTION      Current Medications: Current Meds  Medication Sig  . albuterol (PROVENTIL) (2.5 MG/3ML) 0.083% nebulizer solution Take 2.5 mg by nebulization as needed.   . Albuterol Sulfate (PROAIR HFA IN) Inhale 2 puffs into the lungs daily as needed (wheezing).   Marland Kitchen allopurinol (ZYLOPRIM) 300 MG tablet Take 300 mg by mouth daily.  . colchicine 0.6 MG tablet Take 0.6 mg by mouth as directed. Take 2 tablets onset and 1 tablet 1 hour later, as needed for gout  . fluticasone-salmeterol (ADVAIR HFA) 230-21 MCG/ACT inhaler INHALE 2 PUFFS BY MOUTH TWICE A DAY RINSE MOUTH AND THOART AFTER USE  . furosemide (LASIX) 20 MG tablet Take 40 mg by mouth 2 (two) times daily.   Marland Kitchen ipratropium-albuterol (DUONEB) 0.5-2.5 (3) MG/3ML SOLN Take 1 ampule by nebulization every 6 (six) hours as needed.  . levETIRAcetam (KEPPRA) 1000 MG tablet Take 1,500 mg by mouth 2 (two) times daily.   Marland Kitchen lisinopril (PRINIVIL,ZESTRIL) 5 MG tablet Take 5 mg by mouth daily. Do not take if Systolic 846 or less  . metoprolol tartrate (LOPRESSOR) 25 MG tablet Take 25 mg by mouth 2 (two) times daily.   . mirtazapine (REMERON) 15 MG tablet Take 22.5 mg by mouth at bedtime.   . nitroGLYCERIN (NITROSTAT) 0.4 MG SL tablet Place 0.4 mg under the tongue every 5 (five) minutes as needed for chest pain.   Marland Kitchen OXcarbazepine (TRILEPTAL) 150 MG tablet One tablet twice a day for 2 weeks, then take 2 tablets twice a day  . OXYGEN Inhale into the lungs. Uses 3 liters at night, and 1 -2 liters during the day  . potassium chloride SA (K-DUR,KLOR-CON) 20 MEQ tablet Take 20 mEq by mouth daily.  . pravastatin (PRAVACHOL) 40 MG tablet Take 40 mg by mouth daily.   . pregabalin (LYRICA) 200 MG capsule Take 200 mg by mouth 2 (two) times daily.   . Rivaroxaban (XARELTO) 15 MG TABS tablet Take 15 mg by mouth daily.   . Venlafaxine HCl 75 MG TB24 Take 75 mg by mouth every morning.   . Vitamin D,  Ergocalciferol, (DRISDOL) 50000 units CAPS capsule Take 50,000 Units by mouth every 7 (seven) days.     Allergies:   Penicillins   Social History   Social History  . Marital status: Divorced    Spouse name: N/A  . Number of children: 4  . Years of education: 8   Social History Main Topics  . Smoking status: Current Every Day Smoker    Types: E-cigarettes, Cigarettes  . Smokeless tobacco: Former Systems developer  . Alcohol use No  . Drug use: No  . Sexual activity: Not Asked   Other Topics Concern  . None   Social History Narrative   Lives with daughter in law, son, grandkids   Caffeine use: 2-3 drinks per day        Family History: The patient's family history includes Cancer in his sister; Diabetes in his father; Stroke in his father. ROS:   Please see the history of present illness.    All other systems reviewed and are negative.  EKGs/Labs/Other Studies Reviewed:  The following studies were reviewed today:   Recent Labs: 09/17/2016: B Natriuretic Peptide 220.5; BUN 22; Creatinine, Ser 1.15; Hemoglobin 14.5; Platelets 195; Potassium 3.9; Sodium 139  Recent Lipid Panel No results found for: CHOL, TRIG, HDL, CHOLHDL, VLDL, LDLCALC, LDLDIRECT  Physical Exam:    VS:  BP 118/82 (BP Location: Right Arm, Patient Position: Sitting)   Pulse 70   Ht 5\' 6"  (1.676 m)   Wt 171 lb (77.6 kg)   SpO2 92%   BMI 27.60 kg/m     Wt Readings from Last 3 Encounters:  05/20/17 171 lb (77.6 kg)  05/17/17 172 lb 8 oz (78.2 kg)  12/09/16 160 lb (72.6 kg)     GEN: Frail, apatheticchronically ill appearing  in no acute distress HEENT: Normal NECK: No JVD; No carotid bruits LYMPHATICS: No lymphadenopathy CARDIAC: RRR, no murmurs, rubs, gallops RESPIRATORY:  Clear to auscultation without rales, wheezing or rhonchi  ABDOMEN: Soft, non-tender, non-distended MUSCULOSKELETAL:  No edema; No deformity  SKIN: Warm and dry NEUROLOGIC:  Alert and oriented x 3 PSYCHIATRIC:  Normal affect     Signed, Shirlee More, MD  05/20/2017 1:12 PM    [facility]

## 2017-05-20 ENCOUNTER — Encounter: Payer: Self-pay | Admitting: Cardiology

## 2017-05-20 ENCOUNTER — Ambulatory Visit (INDEPENDENT_AMBULATORY_CARE_PROVIDER_SITE_OTHER): Payer: Medicaid Other | Admitting: Cardiology

## 2017-05-20 VITALS — BP 118/82 | HR 70 | Ht 66.0 in | Wt 171.0 lb

## 2017-05-20 DIAGNOSIS — Z7901 Long term (current) use of anticoagulants: Secondary | ICD-10-CM | POA: Diagnosis not present

## 2017-05-20 DIAGNOSIS — I481 Persistent atrial fibrillation: Secondary | ICD-10-CM | POA: Diagnosis not present

## 2017-05-20 DIAGNOSIS — I110 Hypertensive heart disease with heart failure: Secondary | ICD-10-CM

## 2017-05-20 DIAGNOSIS — I639 Cerebral infarction, unspecified: Secondary | ICD-10-CM | POA: Insufficient documentation

## 2017-05-20 DIAGNOSIS — Z950 Presence of cardiac pacemaker: Secondary | ICD-10-CM | POA: Diagnosis not present

## 2017-05-20 DIAGNOSIS — I69359 Hemiplegia and hemiparesis following cerebral infarction affecting unspecified side: Secondary | ICD-10-CM

## 2017-05-20 DIAGNOSIS — I482 Chronic atrial fibrillation, unspecified: Secondary | ICD-10-CM

## 2017-05-20 DIAGNOSIS — I4819 Other persistent atrial fibrillation: Secondary | ICD-10-CM

## 2017-05-20 DIAGNOSIS — I5032 Chronic diastolic (congestive) heart failure: Secondary | ICD-10-CM

## 2017-05-20 DIAGNOSIS — I25119 Atherosclerotic heart disease of native coronary artery with unspecified angina pectoris: Secondary | ICD-10-CM

## 2017-05-20 NOTE — Patient Instructions (Addendum)
Medication Instructions:  Your physician recommends that you continue on your current medications as directed. Please refer to the Current Medication list given to you today.   Labwork: None  Testing/Procedures: None  Follow-Up: Your physician wants you to follow-up in: 6 months. You will receive a reminder letter in the mail two months in advance. If you don't receive a letter, please call our office to schedule the follow-up appointment.   Any Other Special Instructions Will Be Listed Below (If Applicable).     If you need a refill on your cardiac medications before your next appointment, please call your pharmacy.    Heart Failure  Weigh yourself every morning when you first wake up and record on a calender or note pad, bring this to your office visits. Using a pill tender can help with taking your medications consistently.  Limit your fluid intake to 2 liters daily  Limit your sodium intake to less than 2-3 grams daily. Ask if you need dietary teaching.  If you gain more than 3 pounds (from your dry weight ), double your dose of diuretic for the day.  If you gain more than 5 pounds (from your dry weight), double your dose of lasix and call your heart failure doctor.  Please do not smoke tobacco since it is very bad for your heart.  Please do not drink alcohol since it can worsen your heart failure.Also avoid OTC nonsteroidal drugs, such as advil, aleve and motrin.  Try to exercise for at least 30 minutes every day because this will help your heart be more efficient. You may be eligible for supervised cardiac rehab, ask your physician.

## 2017-06-03 ENCOUNTER — Ambulatory Visit (INDEPENDENT_AMBULATORY_CARE_PROVIDER_SITE_OTHER): Payer: Medicaid Other | Admitting: Sports Medicine

## 2017-06-03 ENCOUNTER — Encounter (INDEPENDENT_AMBULATORY_CARE_PROVIDER_SITE_OTHER): Payer: Self-pay

## 2017-06-03 DIAGNOSIS — I739 Peripheral vascular disease, unspecified: Secondary | ICD-10-CM

## 2017-06-03 DIAGNOSIS — B351 Tinea unguium: Secondary | ICD-10-CM | POA: Diagnosis not present

## 2017-06-03 DIAGNOSIS — M79675 Pain in left toe(s): Secondary | ICD-10-CM

## 2017-06-03 DIAGNOSIS — M79676 Pain in unspecified toe(s): Secondary | ICD-10-CM | POA: Diagnosis not present

## 2017-06-03 DIAGNOSIS — J439 Emphysema, unspecified: Secondary | ICD-10-CM

## 2017-06-03 DIAGNOSIS — M79674 Pain in right toe(s): Secondary | ICD-10-CM

## 2017-06-03 NOTE — Progress Notes (Signed)
Subjective: Aaron Peterson. is a 66 y.o. male patient seen today in office with complaint of painful thickened and elongated toenails; unable to trim. Patient continues with Oxygen for Emphysema and COPD. Reports PCP added a seizure medication but can not remember the name of it. Patient has no other pedal complaints at this time.   Patient Active Problem List   Diagnosis Date Noted  . Stroke ([place]) 05/20/2017  . 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 Focal motor seizure ([place]) 05/17/2017  . Pacemaker reprogramming/check 01/14/2017  . Chronic systolic congestive heart failure ([place]) 12/02/2016  . Emphysema lung ([place]) 12/02/2016  . Essential hypertension 12/02/2016  . Hypercholesterolemia 12/02/2016  . Pacemaker 12/02/2016  . Persistent atrial fibrillation ([place]) 12/02/2016  . Coronary artery disease involving native coronary artery of native heart with angina pectoris ([place]) 10/18/2016  . Current use of long term anticoagulation 10/18/2016  . Hx of completed stroke 10/18/2016  . Hypotension due to drugs 10/18/2016  . IFG (impaired fasting glucose) 10/18/2016  . Moderate episode of recurrent major depressive disorder ([place]) 10/18/2016  . Pulmonary nodule, left 10/18/2016  . Chronic anticoagulation 04/17/2016  . Chronic atrial fibrillation ([place]) 04/17/2016  . Chronic diastolic heart failure ([place]) 04/17/2016  . Hyperlipidemia 04/17/2016  . Hypertensive heart disease with heart failure ([place]) 04/17/2016    Current Outpatient Prescriptions on File Prior to Visit  Medication Sig Dispense Refill  . albuterol (PROVENTIL) (2.5 MG/3ML) 0.083% nebulizer solution Take 2.5 mg by nebulization as needed.     . Albuterol Sulfate (PROAIR HFA IN) Inhale 2 puffs into the lungs daily as needed (wheezing).     Marland Kitchen allopurinol (ZYLOPRIM) 300 MG tablet Take 300 mg by mouth daily.    . colchicine 0.6 MG tablet Take 0.6 mg by mouth as directed. Take 2 tablets onset and 1 tablet 1 hour later, as  needed for gout    . fluticasone-salmeterol (ADVAIR HFA) 230-21 MCG/ACT inhaler INHALE 2 PUFFS BY MOUTH TWICE A DAY RINSE MOUTH AND THOART AFTER USE    . furosemide (LASIX) 20 MG tablet Take 40 mg by mouth 2 (two) times daily.     Marland Kitchen ipratropium-albuterol (DUONEB) 0.5-2.5 (3) MG/3ML SOLN Take 1 ampule by nebulization every 6 (six) hours as needed.    . levETIRAcetam (KEPPRA) 1000 MG tablet Take 1,500 mg by mouth 2 (two) times daily.     Marland Kitchen lisinopril (PRINIVIL,ZESTRIL) 5 MG tablet Take 5 mg by mouth daily. Do not take if Systolic 161 or less    . metoprolol tartrate (LOPRESSOR) 25 MG tablet Take 25 mg by mouth 2 (two) times daily.     . mirtazapine (REMERON) 15 MG tablet Take 22.5 mg by mouth at bedtime.     . nitroGLYCERIN (NITROSTAT) 0.4 MG SL tablet Place 0.4 mg under the tongue every 5 (five) minutes as needed for chest pain.     Marland Kitchen OXcarbazepine (TRILEPTAL) 150 MG tablet One tablet twice a day for 2 weeks, then take 2 tablets twice a day 120 tablet 3  . OXYGEN Inhale into the lungs. Uses 3 liters at night, and 1 -2 liters during the day    . potassium chloride SA (K-DUR,KLOR-CON) 20 MEQ tablet Take 20 mEq by mouth daily.    . pravastatin (PRAVACHOL) 40 MG tablet Take 40 mg by mouth daily.     . pregabalin (LYRICA) 200 MG capsule Take 200 mg by mouth 2 (two) times daily.     . Rivaroxaban (XARELTO) 15 MG  TABS tablet Take 15 mg by mouth daily.     . Venlafaxine HCl 75 MG TB24 Take 75 mg by mouth every morning.     . Vitamin D, Ergocalciferol, (DRISDOL) 50000 units CAPS capsule Take 50,000 Units by mouth every 7 (seven) days.     No current facility-administered medications on file prior to visit.     Allergies  Allergen Reactions  . Penicillins Swelling    Oral edema    Objective: Physical Exam  General: Well developed, nourished, no acute distress, awake, alert and oriented x 3  Vascular: Dorsalis pedis artery 1/4 bilateral, Posterior tibial artery 1/4 bilateral, skin temperature  warm to warm proximal to distal bilateral lower extremities, mild varicosities, scant pedal hair present bilateral. Trace edema bilateral.   Neurological: Gross sensation present via light touch bilateral.   Dermatological: Skin is warm, dry, and supple bilateral, Nails 1-10 are tender, long, thick, and discolored with mild subungal debris with left>right 1st toenail most involved, no webspace macerations present bilateral, no open lesions present bilateral, no callus/corns/hyperkeratotic tissue present bilateral. No signs of infection bilateral.  Musculoskeletal: No symptomatic boney deformities noted bilateral. Muscular strength within normal limits without painon range of motion. No pain with calf compression bilateral.  Assessment and Plan:  Problem List Items Addressed This Visit    None    Visit Diagnoses    Dermatophytosis of nail    -  Primary   Toe pain, bilateral       PVD (peripheral vascular disease) ([place])       Pulmonary emphysema, unspecified emphysema type ([place])          -Examined patient.  -Discussed treatment options for painful mycotic nails. -Mechanically debrided and reduced mycotic nails with sterile nail nipper and dremel nail file without incident. ABN signed. -Encouraged continued elevation when sitting to assist with edema control and lower extremities -Patient to return in 3 months for follow up evaluation or sooner if symptoms worsen.  Landis Martins, DPM

## 2017-06-08 ENCOUNTER — Other Ambulatory Visit (HOSPITAL_COMMUNITY): Payer: Self-pay | Admitting: Urology

## 2017-06-08 ENCOUNTER — Telehealth: Payer: Self-pay | Admitting: Neurology

## 2017-06-08 DIAGNOSIS — C61 Malignant neoplasm of prostate: Secondary | ICD-10-CM

## 2017-06-08 MED ORDER — OXCARBAZEPINE 600 MG PO TABS: 600.0000 mg | ORAL_TABLET | Freq: Two times a day (BID) | ORAL | 1 refills | Status: DC

## 2017-06-08 NOTE — Addendum Note (Signed)
Addended by: Kathrynn Ducking on: 06/08/2017 11:58 AM   Modules accepted: Orders

## 2017-06-08 NOTE — Telephone Encounter (Signed)
Patients daughter in law Di Kindle (listed on DPR) called office in reference to  levETIRAcetam (KEPPRA) 1000 MG tablet and OXcarbazepine (TRILEPTAL) 150 MG tablet.  Patient has been having seizures everyday recently totally 6 a day (yesterday.  Today patient has already had 3 seizures.  Please call

## 2017-06-08 NOTE — Telephone Encounter (Signed)
I called the patient, talk with the family. The patient is on Keppra 1500 mg twice daily, he is on 300 mg of Trileptal twice daily, we'll go to 450 mg twice daily Trileptal now, after one week converted to 600 mg twice daily.  If the seizures become quite prolonged or if the patient is not recovering his mental status after the seizure, they are to go to the emergency room.  Currently the seizures are brief, lasting 1 minute, the patient is unable to talk during the seizure, but he is able to talk a since the seizure stops. Occasionally he may have some slight jerking of the right leg too.

## 2017-06-10 NOTE — Telephone Encounter (Signed)
I called and talked with the son-in-law. The daughter took the patient to the hospital, he is at [facility], he has had drowsiness on the Trileptal dose increase, he is still having some seizures.  We may have to stop the Trileptal and go on Vimpat. The patient continues to have ongoing significant problems with the seizures.

## 2017-06-10 NOTE — Telephone Encounter (Signed)
Patient daughter in law Karma Ganja (listed on DPR) called office in reference to oxcarbazepine (TRILEPTAL) 600 MG tablet and levETIRAcetam (KEPPRA) 1000 MG tablet.  Per patient he is sleeping all day after taking medications only getting up to eat and use the restroom.  Per patient also he feels bad all over and drained.  JoAnna and patient are not sure if he is feeling this way from [place] or Trileptal, but would like to see what Dr. Jannifer Franklin suggest about possibly decreasing dosage.  Pharmacy- [facility].  Please call

## 2017-06-15 NOTE — Telephone Encounter (Signed)
Pt daughter in law has called back to inform they are now at [facility], she wanted it also known that pt has complained of blurry vision and is hardly able to walk.  Daughter in law states Dr Jannifer Franklin can call her but may not be able to reach her.  She is asking that Dr Jannifer Franklin calls the hospital for input re: pt.

## 2017-06-15 NOTE — Telephone Encounter (Signed)
Di Kindle patient's daughter-in-law and caregiver is calling stating the patient is shaking really bad and can hardly walk. Trileptal had been increased because an increase in seizures. She is very concerned and would like a call back.

## 2017-06-15 NOTE — Telephone Encounter (Signed)
I called and left a message, unable to get through to the daughter. The patient has to go to the hospital again, he does not appear that he is tolerating the Trileptal well, we will likely have to alter medical therapy.  I will get him worked in revisit for him.

## 2017-06-16 NOTE — Telephone Encounter (Signed)
I called the daughter. I do not think the patient was tolerating the Trileptal. He never went to 6 on milligrams twice daily, and he is on on 300 mg twice daily, he will stop the medication, the Keppra levels were over the upper limits of therapeutic in the emergency room.  We will cut back on the Keppra going to 1000 mg in the morning and 1500 mg in the evening.  When I see him in office we will plan on adding Vimpat.

## 2017-06-16 NOTE — Telephone Encounter (Signed)
Called and spoke with daughter in law. Scheduled appt for 06/22/17 at 12pm, check in 1130am for work in visit.   She wanted to check with Dr Jannifer Franklin to see if he wanted to make adjustments to trileptal dosing. When he left ED, they advised him to lower dose of trileptal to 300mg  2x/day. She is wondering if they should taper down more before appt or wait until appt to make any changes. Advised I will send message to CW,MD to advise. We will call either today or tomorrow and let them know. She verbalized understanding.

## 2017-06-18 ENCOUNTER — Encounter (HOSPITAL_COMMUNITY)
Admission: RE | Admit: 2017-06-18 | Discharge: 2017-06-18 | Disposition: A | Discharge status: Home / Self Care | Payer: Medicaid Other | Source: Ambulatory Visit | Attending: Urology | Admitting: Urology

## 2017-06-18 DIAGNOSIS — C61 Malignant neoplasm of prostate: Secondary | ICD-10-CM | POA: Diagnosis present

## 2017-06-18 MED ORDER — TECHNETIUM TC 99M MEDRONATE IV KIT
20.6000 | PACK | Freq: Once | INTRAVENOUS | Status: AC | PRN
  Administered 2017-06-18: 20.6 via INTRAVENOUS

## 2017-06-22 ENCOUNTER — Encounter: Payer: Self-pay | Admitting: Neurology

## 2017-06-22 ENCOUNTER — Ambulatory Visit (INDEPENDENT_AMBULATORY_CARE_PROVIDER_SITE_OTHER): Payer: Medicaid Other | Admitting: Neurology

## 2017-06-22 ENCOUNTER — Encounter (INDEPENDENT_AMBULATORY_CARE_PROVIDER_SITE_OTHER): Payer: Self-pay

## 2017-06-22 VITALS — BP 90/50 | HR 71 | Ht 66.0 in | Wt 173.0 lb

## 2017-06-22 DIAGNOSIS — G40109 Localization-related (focal) (partial) symptomatic epilepsy and epileptic syndromes with simple partial seizures, not intractable, without status epilepticus: Secondary | ICD-10-CM

## 2017-06-22 DIAGNOSIS — I951 Orthostatic hypotension: Secondary | ICD-10-CM | POA: Diagnosis not present

## 2017-06-22 HISTORY — DX: Orthostatic hypotension: I95.1

## 2017-06-22 MED ORDER — LACOSAMIDE 50 MG PO TABS: 50.0000 mg | ORAL_TABLET | Freq: Two times a day (BID) | ORAL | 3 refills | Status: DC

## 2017-06-22 NOTE — Progress Notes (Signed)
Reason for visit: Focal seizures  Aaron Peterson. is an 66 y.o. male  History of present illness:  Aaron Peterson is a 66 year old right-handed white male with a history of multiple medical issues. The patient has significant problems with congestive heart failure and atrial fibrillation on anticoagulation. He has prostate cancer and he has recently been diagnosed with stage IV lung cancer. The patient has had episodes of right arm jerking that occasionally may spread to the right leg. The patient may have some clouding of consciousness with this. Most of his episodes have occurred while standing up, he has had at least one event while lying down and four events while sitting. The patient will have collapse of the legs when he is standing. The patient has not actually fallen down. He has recently been placed on Trileptal but he did not tolerate the medication, this was discontinued. The Keppra is resulting in some daytime drowsiness, blood levels done through the emergency room were elevated in the 74 range. The Keppra dose has been decreased slightly to 1000 mg in the morning and 1500 mg at night. The patient returns to the office today for an evaluation.  Past Medical History:  Diagnosis Date  . A-fib ([place])   . CHF (congestive heart failure) ([place])   . Chronic anticoagulation 04/17/2016  . Chronic atrial fibrillation ([place]) 04/17/2016  . Chronic diastolic heart failure ([place]) 04/17/2016  . Chronic systolic congestive heart failure ([place]) 12/02/2016  . COPD (chronic obstructive pulmonary disease) ([place])   . Coronary artery disease involving native coronary artery of native heart with angina pectoris ([place]) 10/18/2016  . Current use of long term anticoagulation 10/18/2016  . Depression   . Emphysema lung ([place]) 12/02/2016  . Essential hypertension 12/02/2016  . 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 High cholesterol   . Hx of completed stroke 10/18/2016  . Hyperlipidemia 04/17/2016  .  Hypertension   . Hypertensive heart disease with heart failure ([place]) 04/17/2016  . Hypotension due to drugs 10/18/2016  . IFG (impaired fasting glucose) 10/18/2016  . Moderate episode of recurrent major depressive disorder ([place]) 10/18/2016  . Myocardial infarction ([place])   . Pacemaker   . Pacemaker reprogramming/check 01/14/2017  . Persistent atrial fibrillation ([place]) 12/02/2016  . Pulmonary nodule, left 10/18/2016  . Sleep apnea   . Stroke [facility])     Past Surgical History:  Procedure Laterality Date  . PACEMAKER INSERTION      Family History  Problem Relation Age of Onset  . Stroke Father   . Diabetes Father   . Cancer Sister     Social history:  reports that he has been smoking E-cigarettes and Cigarettes.  He has quit using smokeless tobacco. He reports that he does not drink alcohol or use drugs.    Allergies  Allergen Reactions  . Penicillins Swelling    Oral edema    Medications:  Prior to Admission medications   Medication Sig Start Date End Date Taking? Authorizing Provider  albuterol (PROVENTIL) (2.5 MG/3ML) 0.083% nebulizer solution Take 2.5 mg by nebulization as needed.    Yes [provider]  Albuterol Sulfate (PROAIR HFA IN) Inhale 2 puffs into the lungs daily as needed (wheezing).    Yes [provider]  allopurinol (ZYLOPRIM) 300 MG tablet Take 300 mg by mouth daily.   Yes [provider]  colchicine 0.6 MG tablet Take 0.6 mg by mouth as directed. Take 2 tablets onset and 1 tablet 1 hour later,  as needed for gout   Yes [provider]  fluticasone-salmeterol (ADVAIR HFA) 230-21 MCG/ACT inhaler INHALE 2 PUFFS BY MOUTH TWICE A DAY RINSE MOUTH AND THOART AFTER USE 01/16/16  Yes [provider]  furosemide (LASIX) 20 MG tablet Take 40 mg by mouth 2 (two) times daily.  11/10/16  Yes [provider]  ipratropium-albuterol (DUONEB) 0.5-2.5 (3) MG/3ML SOLN Take 1 ampule by nebulization every 6 (six) hours as needed.   Yes  [provider]  levETIRAcetam (KEPPRA) 1000 MG tablet Take 1,500 mg by mouth 2 (two) times daily. 1000mg  in the am and 1500mg  in the pm   Yes [provider]  lisinopril (PRINIVIL,ZESTRIL) 5 MG tablet Take 5 mg by mouth daily. Do not take if Systolic 409 or less 05/05/18  Yes [provider]  metoprolol tartrate (LOPRESSOR) 25 MG tablet Take 25 mg by mouth 2 (two) times daily.    Yes [provider]  mirtazapine (REMERON) 15 MG tablet Take 22.5 mg by mouth at bedtime.  04/10/16  Yes [provider]  nitroGLYCERIN (NITROSTAT) 0.4 MG SL tablet Place 0.4 mg under the tongue every 5 (five) minutes as needed for chest pain.    Yes [provider]  OXYGEN Inhale into the lungs. Uses 3 liters at night, and 1 -2 liters during the day   Yes [provider]  potassium chloride SA (K-DUR,KLOR-CON) 20 MEQ tablet Take 20 mEq by mouth daily.   Yes [provider]  pravastatin (PRAVACHOL) 40 MG tablet Take 40 mg by mouth daily.    Yes [provider]  pregabalin (LYRICA) 200 MG capsule Take 200 mg by mouth 2 (two) times daily.  10/20/16 10/20/17 Yes [provider]  Rivaroxaban (XARELTO) 15 MG TABS tablet Take 15 mg by mouth daily.    Yes [provider]  Venlafaxine HCl 75 MG TB24 Take 75 mg by mouth every morning.    Yes [provider]  Vitamin D, Ergocalciferol, (DRISDOL) 50000 units CAPS capsule Take 50,000 Units by mouth every 7 (seven) days.   Yes [provider]    ROS:  Out of a complete 14 system review of symptoms, the patient complains only of the following symptoms, and all other reviewed systems are negative.  Decreased activity, decreased appetite, fatigue Blurred vision Cough, wheezing, shortness of breath, choking Excessive thirst Sleep apnea, frequent waking, daytime sleepiness, snoring, sleep talking, acting out dreams Walking difficulty Memory loss, speech difficulty,  seizures, tremor, facial drooping Confusion, depression  Blood pressure (!) 90/50, pulse 71, height 5\' 6"  (1.676 m), weight 173 lb (78.5 kg), SpO2 91 %.   Systolic blood pressure, right arm, standing is 70. Blood pressure, right arm, sitting is 96. Pulse is irregularly irregular.  Physical Exam  General: The patient is alert and cooperative at the time of the examination.  Skin: No significant peripheral edema is noted.   Neurologic Exam  Mental status: The patient is alert and oriented x 3 at the time of the examination. The patient has apparent normal recent and remote memory, with an apparently normal attention span and concentration ability.   Cranial nerves: Facial symmetry is not present, the patient has some facial droop on the right. Speech is normal, no aphasia or dysarthria is noted. Extraocular movements are full. Visual fields are full.  Motor: The patient has good strength in all 4 extremities.  Sensory examination: Soft touch sensation is symmetric on the face, arms, and legs.  Coordination: The patient  has good finger-nose-finger and heel-to-shin bilaterally. Occasional tremors are noted with the right arm.  Gait and station: The patient has a slightly wide-based gait. Tandem gait is slightly unsteady. Romberg is negative. No drift is seen.  Reflexes: Deep tendon reflexes are symmetric.   Assessment/Plan:  1. Focal seizures, right arm and leg  2. Orthostatic hypotension  The patient is dropping his blood pressure significantly with standing, this may be a contributor factor to the events of jerking and leg collapse that are most likely to occur while standing. The patient has significant cardiac issues and he recently was diagnosed with stage IV lung cancer. The patient is to stay well hydrated during the day, reduction or cessation of blood pressure medications may be required. The patient will be reduced on the Keppra taking 1000 mg twice daily, Vimpat will be  added taking 50 mg twice daily. The patient will follow-up in 2 months.  Jill Alexanders MD 06/22/2017 12:16 PM  [facility] [address] [place], [place] [address]  Phone [phone] Fax [phone]

## 2017-06-22 NOTE — Patient Instructions (Signed)
   Reduce the keppra to 1000 mg twice a day. We will start Vimpat 50 mg twice a day.

## 2017-06-22 NOTE — Progress Notes (Signed)
Faxed printed/signed rx Vimpat to [place] at [phone]. Received fax confirmation.

## 2017-06-23 ENCOUNTER — Other Ambulatory Visit: Payer: Self-pay | Admitting: *Deleted

## 2017-06-23 MED ORDER — LEVETIRACETAM 1000 MG PO TABS: 1000.0000 mg | ORAL_TABLET | Freq: Two times a day (BID) | ORAL | 5 refills | Status: AC

## 2017-06-28 DIAGNOSIS — R918 Other nonspecific abnormal finding of lung field: Secondary | ICD-10-CM | POA: Diagnosis not present

## 2017-06-28 DIAGNOSIS — C61 Malignant neoplasm of prostate: Secondary | ICD-10-CM | POA: Diagnosis not present

## 2017-06-28 DIAGNOSIS — R590 Localized enlarged lymph nodes: Secondary | ICD-10-CM | POA: Diagnosis not present

## 2017-06-28 DIAGNOSIS — C7970 Secondary malignant neoplasm of unspecified adrenal gland: Secondary | ICD-10-CM | POA: Diagnosis not present

## 2017-07-14 DIAGNOSIS — C3490 Malignant neoplasm of unspecified part of unspecified bronchus or lung: Secondary | ICD-10-CM | POA: Diagnosis not present

## 2017-07-14 DIAGNOSIS — C7972 Secondary malignant neoplasm of left adrenal gland: Secondary | ICD-10-CM | POA: Diagnosis not present

## 2017-07-14 DIAGNOSIS — Z8546 Personal history of malignant neoplasm of prostate: Secondary | ICD-10-CM | POA: Diagnosis not present

## 2017-07-15 ENCOUNTER — Ambulatory Visit: Payer: Medicaid Other | Admitting: Nurse Practitioner

## 2017-07-29 ENCOUNTER — Telehealth: Payer: Self-pay

## 2017-07-29 NOTE — Telephone Encounter (Signed)
Aaron Peterson from [facility] called to let Dr. Jannifer Franklin know that the patient has been having daily seizures for about 2-3 weeks. A few days ago he had 9 in one day, 5 the next day, and 3 the day after that. She wasn't sure if Dr. Jannifer Franklin wanted to make any med changes before his f/u next week. Barnett Applebaum is available today until 5pm at [phone], but if you cannot call until after 5 or tomorrow then call the Hospice office at [phone].

## 2017-07-30 MED ORDER — LACOSAMIDE 100 MG PO TABS: 100.0000 mg | ORAL_TABLET | Freq: Two times a day (BID) | ORAL | 3 refills | Status: AC

## 2017-07-30 NOTE — Telephone Encounter (Signed)
I called hospice, the patient is still having some seizures, we will go up on the Vimpat taking 100 mg twice daily.  He is also on Lyrica and Keppra.  He has focal seizures that are difficult to control.

## 2017-07-30 NOTE — Addendum Note (Signed)
Addended by: Kathrynn Ducking on: 07/30/2017 08:10 AM   Modules accepted: Orders

## 2017-07-30 NOTE — Telephone Encounter (Signed)
Faxed printed/signed rx lacosamide to [facility] at [phone]. Received fax confirmation.

## 2017-08-03 NOTE — Telephone Encounter (Signed)
Asencion Partridge from [facility] is calling re: pt's Vimpat.  She is stating there is difficulty getting this thru  [place]. [phone] (Phone) [phone] (Fax)   Asencion Partridge states this needs to be filled thru pt's Medicaid and Medicare Lacosamide (VIMPAT) 100 MG TABS is not covered thru Hospice because pt is there for his Lung Cancer.  Please call Asencion Partridge

## 2017-08-03 NOTE — Telephone Encounter (Signed)
I called hospice, Carm[place].  The patient was on Vimpat before he went on hospice, the prescription was sent into his regular pharmacy, the medication will need to be covered outside of the hospice coverage.  He was getting Vimpat on his prior insurance plan.

## 2017-08-04 NOTE — Telephone Encounter (Signed)
Called [employer] and spoke with Eda. Completed PA over the phone. PA approved effective 08/04/17-07/30/18. DG#[id number].  Interaction ID# for call: [id number]. Faxed notice of approval to [place] at [phone]. Received fax confirmation.  Pt dx: Focal motor seizure ICD-10 G40.109.

## 2017-08-04 NOTE — Telephone Encounter (Signed)
Carman with [facility] called to advise [employer] will be calling or sending a fax to our office regarding Rx Vimpat. A returned call is not needed unless there are questions.

## 2017-08-04 NOTE — Telephone Encounter (Signed)
Called and spoke with Evlyn Clines from [employer]. She stated when they try to run rx Vimpat through insurance, they are getting error code asking them to process through other payer (which is hospice). Medicaid declining because of this.  She states they electronically had system fax [place] PA form for rx Vimpat. Advised I have not received anything. Asked her to re-fax to [phone] and I can take a look at it/attempt PA. She verbalized understanding and will fax.

## 2017-08-05 ENCOUNTER — Telehealth: Payer: Self-pay | Admitting: *Deleted

## 2017-08-05 NOTE — Progress Notes (Signed)
[facility]  PATIENT: Aaron Singer Emami Jr. DOB: May 21, 1951   REASON FOR VISIT: Seizure disorder  HISTORY FROM:patient DIL and son    HISTORY OF PRESENT ILLNESS:UPDATE 11/5/2018CM [name], 66 year old male returns for follow-up with history of seizure disorder.  He also has a history of prostate cancer and stage IV liver cancer.  He is now on hospice care.  Blood pressure in the office today by Doppler 106.  He is continuing to have daily seizures.  His Vimpat dose was just increased to 100mg  twice daily on 2 November.  He remains on Keppra thousand twice daily.  He has been unable to tolerate higher doses of the Keppra and has had a toxic level in the past.  According to the family he can still feed himself and can dress and bathe himself independently with supervision.  His seizure events consist of right arm jerking that may spread to the right leg most of his events occurred occurred while standing, he is seated in a wheelchair today.  His pain is managed through hospice and he says his pain is fairly well controlled.  He returns for reevaluation  06/12/2017 KWMr. Aaron Peterson is a 66 year old right-handed white male with a history of multiple medical issues. The patient has significant problems with congestive heart failure and atrial fibrillation on anticoagulation. He has prostate cancer and he has recently been diagnosed with stage IV lung cancer. The patient has had episodes of right arm jerking that occasionally may spread to the right leg. The patient may have some clouding of consciousness with this. Most of his episodes have occurred while standing up, he has had at least one event while lying down and four events while sitting. The patient will have collapse of the legs when he is standing. The patient has not actually fallen down. He has recently been placed on Trileptal but he did not tolerate the medication, this was discontinued. The Keppra is resulting in some  daytime drowsiness, blood levels done through the emergency room were elevated in the 74 range. The Keppra dose has been decreased slightly to 1000 mg in the morning and 1500 mg at night. The patient returns to the office today for an evaluation.   REVIEW OF SYSTEMS: Full 14 system review of systems performed and notable only for those listed, all others are neg:  Constitutional: Fatigue Cardiovascular: neg Ear/Nose/Throat: neg  Skin: neg Eyes: neg Respiratory: Cough, shortness of breath Gastroitestinal: neg  Hematology/Lymphatic: neg  Endocrine: neg Musculoskeletal:neg Allergy/Immunology: neg Neurological: Seizure disorder, weakness Psychiatric: neg Sleep : neg   ALLERGIES: Allergies  Allergen Reactions  . Penicillins Swelling    Oral edema    HOME MEDICATIONS: Outpatient Medications Prior to Visit  Medication Sig Dispense Refill  . allopurinol (ZYLOPRIM) 300 MG tablet Take 300 mg by mouth daily.    . colchicine 0.6 MG tablet Take 0.6 mg by mouth as directed. Take 2 tablets onset and 1 tablet 1 hour later, as needed for gout    . dexamethasone (DECADRON) 4 MG tablet Take 4 mg daily by mouth.    . furosemide (LASIX) 20 MG tablet Take 40 mg by mouth 2 (two) times daily.     Marland Kitchen ipratropium-albuterol (DUONEB) 0.5-2.5 (3) MG/3ML SOLN Take 1 ampule by nebulization every 6 (six) hours as needed.    . Lacosamide (VIMPAT) 100 MG TABS Take 1 tablet (100 mg total) by mouth 2 (two) times daily. 60 tablet 3  . levETIRAcetam (KEPPRA) 1000 MG tablet Take  1 tablet (1,000 mg total) by mouth 2 (two) times daily. 60 tablet 5  . lisinopril (PRINIVIL,ZESTRIL) 5 MG tablet Take 5 mg by mouth daily. Do not take if Systolic 542 or less    . LORazepam (ATIVAN) 0.5 MG tablet Take 0.5 mg every 6 (six) hours as needed by mouth for anxiety.    . metoprolol tartrate (LOPRESSOR) 25 MG tablet Take 25 mg by mouth 2 (two) times daily.     . mirtazapine (REMERON) 15 MG tablet Take 22.5 mg by mouth at bedtime.      . Morphine Sulfate ER 15 MG T12A Take 15 mg 2 (two) times daily by mouth.    . nitroGLYCERIN (NITROSTAT) 0.4 MG SL tablet Place 0.4 mg under the tongue every 5 (five) minutes as needed for chest pain.     Marland Kitchen oxyCODONE (OXY IR/ROXICODONE) 5 MG immediate release tablet Take 5-10 mg every 6 (six) hours as needed by mouth for severe pain.    . OXYGEN Inhale into the lungs. Uses 3 liters at night, and 1 -2 liters during the day    . potassium chloride SA (K-DUR,KLOR-CON) 20 MEQ tablet Take 20 mEq by mouth daily.    . pregabalin (LYRICA) 200 MG capsule Take 200 mg by mouth 2 (two) times daily.     . Rivaroxaban (XARELTO) 15 MG TABS tablet Take 15 mg by mouth daily.     Marland Kitchen senna (SENOKOT) 8.6 MG TABS tablet Take 2 tablets daily as needed by mouth for mild constipation.    Marland Kitchen venlafaxine (EFFEXOR) 75 MG tablet Take 75 mg 2 (two) times daily with a meal by mouth.    . Vitamin D, Ergocalciferol, (DRISDOL) 50000 units CAPS capsule Take 50,000 Units by mouth every 7 (seven) days.    Marland Kitchen albuterol (PROVENTIL) (2.5 MG/3ML) 0.083% nebulizer solution Take 2.5 mg by nebulization as needed.     . Albuterol Sulfate (PROAIR HFA IN) Inhale 2 puffs into the lungs daily as needed (wheezing).     . fluticasone-salmeterol (ADVAIR HFA) 230-21 MCG/ACT inhaler INHALE 2 PUFFS BY MOUTH TWICE A DAY RINSE MOUTH AND THOART AFTER USE    . pravastatin (PRAVACHOL) 40 MG tablet Take 40 mg by mouth daily.     . Venlafaxine HCl 75 MG TB24 Take 75 mg by mouth every morning.      No facility-administered medications prior to visit.     PAST MEDICAL HISTORY: Past Medical History:  Diagnosis Date  . A-fib ([place])   . CHF (congestive heart failure) ([place])   . Chronic anticoagulation 04/17/2016  . Chronic atrial fibrillation ([place]) 04/17/2016  . Chronic diastolic heart failure ([place]) 04/17/2016  . Chronic systolic congestive heart failure ([place]) 12/02/2016  . COPD (chronic obstructive pulmonary disease) ([place])   . Coronary artery disease  involving native coronary artery of native heart with angina pectoris ([place]) 10/18/2016  . Current use of long term anticoagulation 10/18/2016  . Depression   . Emphysema lung ([place]) 12/02/2016  . Essential hypertension 12/02/2016  . Focal motor seizure ([place]) 05/17/2017  . High cholesterol   . Hx of completed stroke 10/18/2016  . Hyperlipidemia 04/17/2016  . Hypertension   . Hypertensive heart disease with heart failure ([place]) 04/17/2016  . Hypotension due to drugs 10/18/2016  . IFG (impaired fasting glucose) 10/18/2016  . Moderate episode of recurrent major depressive disorder ([place]) 10/18/2016  . Myocardial infarction ([place])   . Orthostatic hypotension 06/22/2017  . Pacemaker   . Pacemaker reprogramming/check 01/14/2017  . Persistent  atrial fibrillation ([place]) 12/02/2016  . Pulmonary nodule, left 10/18/2016  . Sleep apnea   . Stroke [facility])     PAST SURGICAL HISTORY: Past Surgical History:  Procedure Laterality Date  . PACEMAKER INSERTION      FAMILY HISTORY: Family History  Problem Relation Age of Onset  . Stroke Father   . Diabetes Father   . Cancer Sister     SOCIAL HISTORY: Social History   Socioeconomic History  . Marital status: Divorced    Spouse name: Not on file  . Number of children: 4  . Years of education: 8  . Highest education level: Not on file  Social Needs  . Financial resource strain: Not on file  . Food insecurity - worry: Not on file  . Food insecurity - inability: Not on file  . Transportation needs - medical: Not on file  . Transportation needs - non-medical: Not on file  Occupational History  . Not on file  Tobacco Use  . Smoking status: Current Every Day Smoker    Types: E-cigarettes, Cigarettes  . Smokeless tobacco: Former Network engineer and Sexual Activity  . Alcohol use: No  . Drug use: No  . Sexual activity: Not on file  Other Topics Concern  . Not on file  Social History Narrative   Lives with daughter in law, son, grandkids   Caffeine use:  2-3 drinks per day     PHYSICAL EXAM  Vitals:   08/09/17 0950  Pulse: 68  Weight: 171 lb 3.2 oz (77.7 kg)  Height: 5\' 6"  (1.676 m)   Body mass index is 27.63 kg/m.  Generalized: Well developed, in no acute distress  Head: normocephalic and atraumatic,. Oropharynx benign  Neck: Supple,  Musculoskeletal: No deformity   Neurological examination   Mentation: Alert oriented to time, place, history taking. Attention span and concentration appropriate. Recent and remote memory intact.  Follows all commands speech and language fluent.   Cranial nerve II-XII: Pupils were equal round reactive to light extraocular movements were full, visual field were full on confrontational test. Facial droop on the right  hearing was intact to finger rubbing bilaterally. Uvula tongue midline. head turning and shoulder shrug were normal and symmetric.Tongue protrusion into cheek strength was normal.  Dry mucous membranes Motor: normal bulk and tone, full strength in the BUE, BLE,  Sensory: normal and symmetric to light touch, pinprick, and  Vibration, in the upper and lower extremities Coordination: finger-nose-finger, heel-to-shin bilaterally, no dysmetria Reflexes: Symmetric upper and lower plantar responses were flexor bilaterally. Gait and Station: Not ambulated due to safety concerns ,in wheelchair DIAGNOSTIC DATA (LABS, IMAGING, TESTING) - I reviewed patient records, labs, notes, testing and imaging myself where available.  Lab Results  Component Value Date   WBC 9.1 09/17/2016   HGB 14.5 09/17/2016   HCT 40.0 09/17/2016   MCV 96.9 09/17/2016   PLT 195 09/17/2016      Component Value Date/Time   NA 139 09/17/2016 1116   K 3.9 09/17/2016 1116   CL 104 09/17/2016 1116   CO2 23 09/17/2016 1116   GLUCOSE 70 09/17/2016 1116   BUN 22 (H) 09/17/2016 1116   CREATININE 1.15 09/17/2016 1116   CALCIUM 8.6 (L) 09/17/2016 1116   GFRNONAA >60 09/17/2016 1116   GFRAA >60 09/17/2016 1116     ASSESSMENT AND PLAN  66 y.o. year old male  has a past medical history of A-fib ([place]), CHF (congestive heart failure) ([place]), Chronic anticoagulation (04/17/2016), Chronic atrial fibrillation (  [place]) (04/17/2016), Chronic diastolic heart failure ([place]) (6/46/8032), Chronic systolic congestive heart failure ([place]) (12/02/2016), COPD (chronic obstructive pulmonary disease) ([place]),  ([place]), Orthostatic hypotension (06/22/2017), Pacemaker, Pacemaker reprogramming/check (01/14/2017), Persistent atrial fibrillation ([place]) (12/02/2016), Pulmonary nodule, left (10/18/2016), Sleep apnea, and Stroke ([place]). here here to follow-up for focal seizures and orthostatic hypotension.  Patient has prostate cancer and stage IV lung cancer and currently has hospice care     Continue Keppra 1000mg  ywice daily Continue Vimpat 100mg  twice daily for now  Dose was just increased and may be continued to be increased seizure activity Stay well-hydrated which will help orthostatic hypotension Follow-up in 2-3 months Dennie Bible, [facility], [facility], [facility] [address] [place], [place] [address] [phone]

## 2017-08-05 NOTE — Telephone Encounter (Signed)
Mailed hospice orders back to [facility] [address] [place], [place] [address] re: vimpat po 50mg  tab BID ended 07/30/17. 07/30/17-vimpat po 100mg  tablet BID started.

## 2017-08-09 ENCOUNTER — Encounter: Payer: Self-pay | Admitting: Nurse Practitioner

## 2017-08-09 ENCOUNTER — Ambulatory Visit: Payer: Medicaid Other | Admitting: Nurse Practitioner

## 2017-08-09 VITALS — HR 68 | Ht 66.0 in | Wt 171.2 lb

## 2017-08-09 DIAGNOSIS — I951 Orthostatic hypotension: Secondary | ICD-10-CM

## 2017-08-09 DIAGNOSIS — G40109 Localization-related (focal) (partial) symptomatic epilepsy and epileptic syndromes with simple partial seizures, not intractable, without status epilepticus: Secondary | ICD-10-CM

## 2017-08-09 NOTE — Patient Instructions (Signed)
Continue Keppra 1000mg  ywice daily Continue Vimpat 100mg  twice daily for now  Follow-up in 2-3 months

## 2017-09-03 ENCOUNTER — Ambulatory Visit: Payer: Self-pay | Admitting: Sports Medicine

## 2017-09-04 DEATH — deceased

## 2017-10-14 ENCOUNTER — Telehealth: Payer: Self-pay | Admitting: Neurology

## 2017-10-14 NOTE — Telephone Encounter (Signed)
Pt daughter in law(on [place]) called to inform that on 08-18-2017 pt passed away

## 2017-10-14 NOTE — Telephone Encounter (Signed)
Events noted

## 2017-10-27 ENCOUNTER — Ambulatory Visit: Payer: Medicaid Other | Admitting: Nurse Practitioner

## 2017-12-05 IMAGING — CT CT HEAD W/O CM
3 of 4 series · 14 of 47 positions shown, 16 images · non-contrast
Comparison: 08/02/2016 and 06/19/2016 head CTs

CLINICAL DATA: 65-year-old male with generalized weakness, blurred
vision and dizziness for 1 day.

EXAM:
CT HEAD WITHOUT CONTRAST
TECHNIQUE: Contiguous axial images were obtained from the base of the skull
through the vertex without intravenous contrast.

[Series 2: head w/o · axial · non-contrast · 0.45mm/px · z∈[-114,+1]mm · 8 of 29 slices shown, 10 images]
[im 3/29  brain]
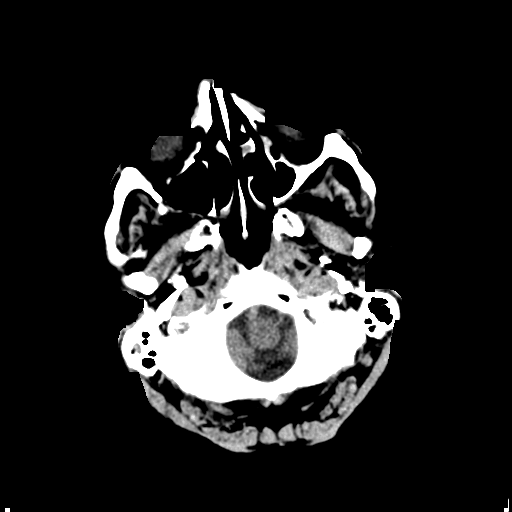
[im 3/29  bone]
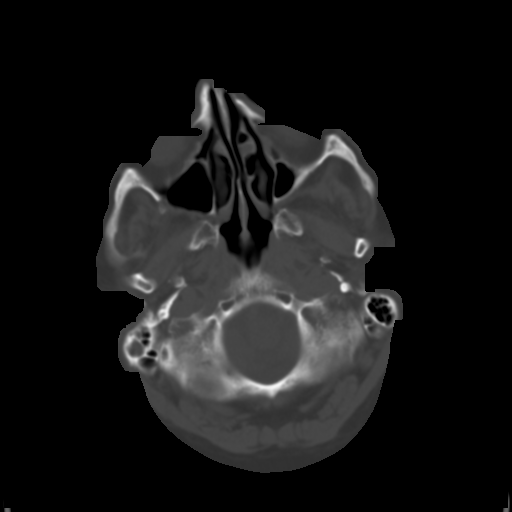
[im 6/29  brain]
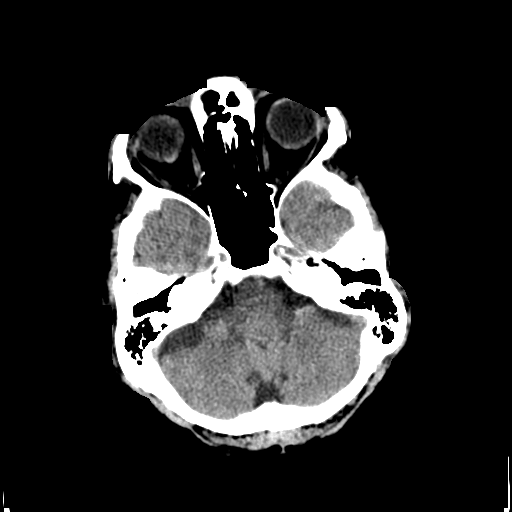
[im 9/29  brain]
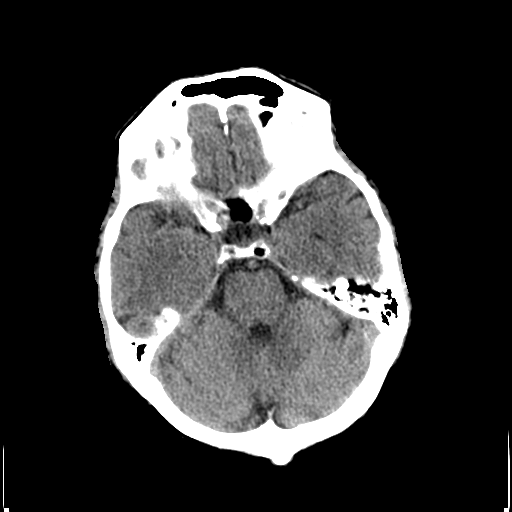
[im 12/29  brain]
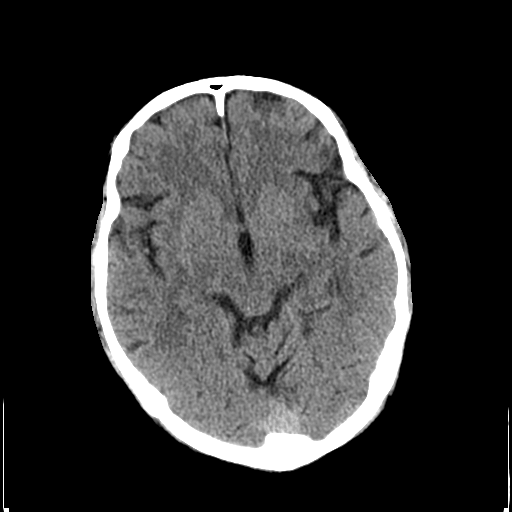
[im 17/29  brain]
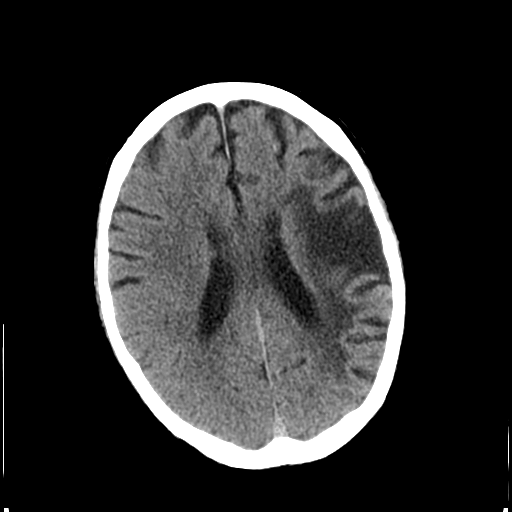
[im 17/29  bone]
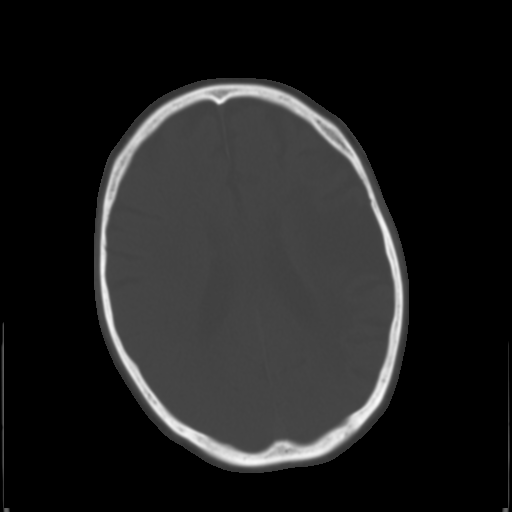
[im 20/29  brain]
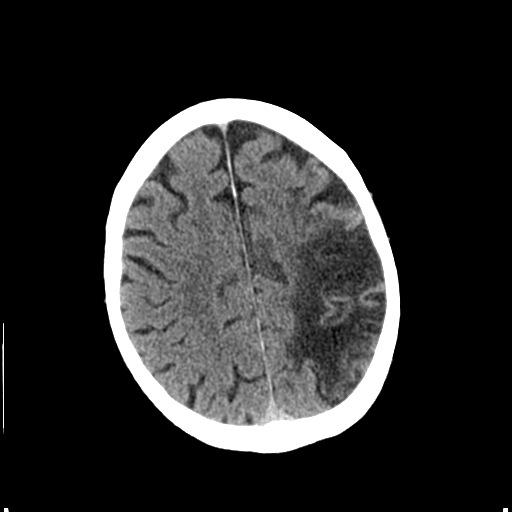
[im 23/29  brain]
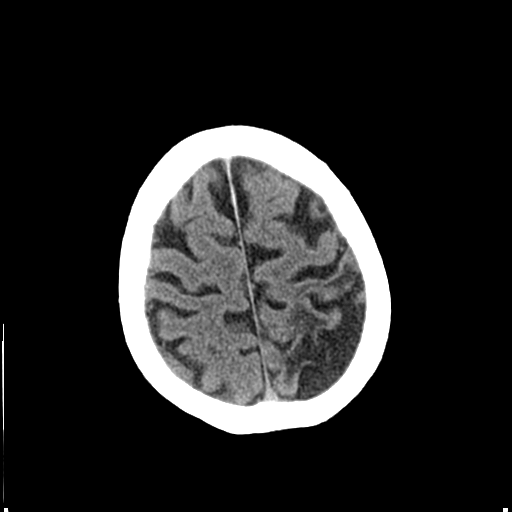
[im 26/29  brain]
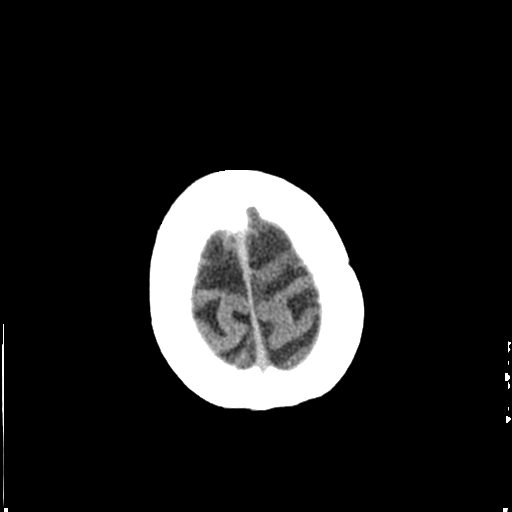

[Series 5: coronal · coronal · 0.27mm/px · 3 of 73 slices shown]
[im 25/73  brain]
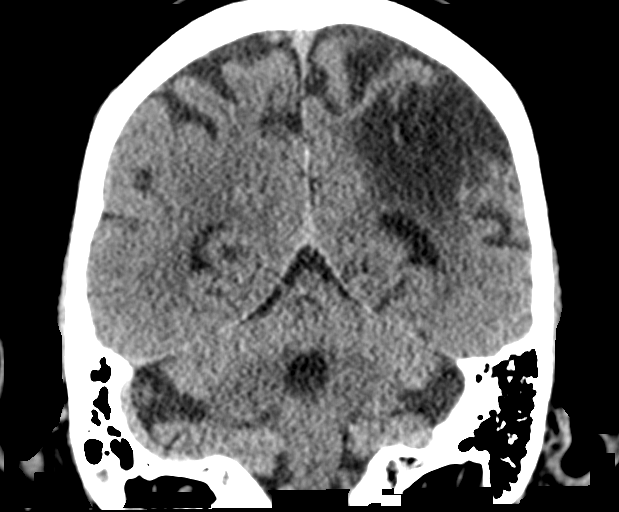
[im 33/73  brain]
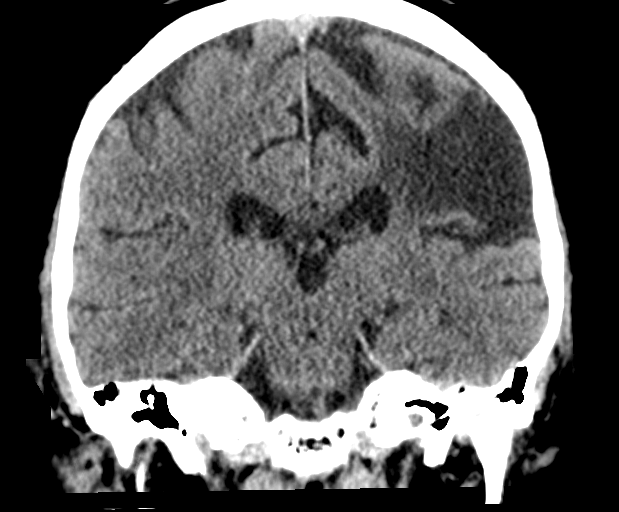
[im 41/73  brain]
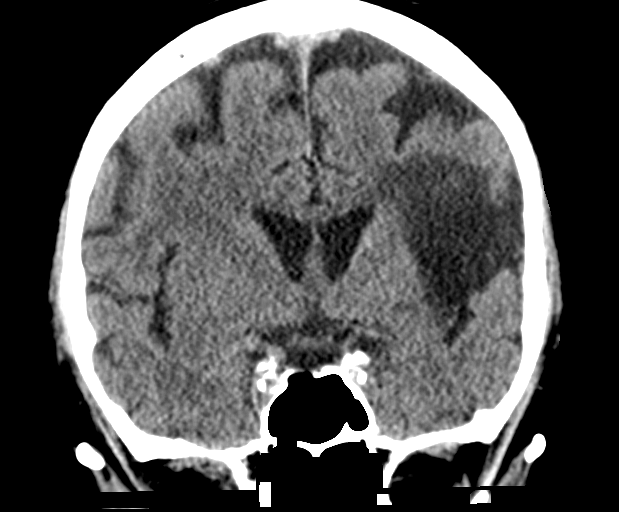

[Series 6: sagittal · sagittal · 0.28mm/px · 3 of 63 slices shown]
[im 21/63  brain]
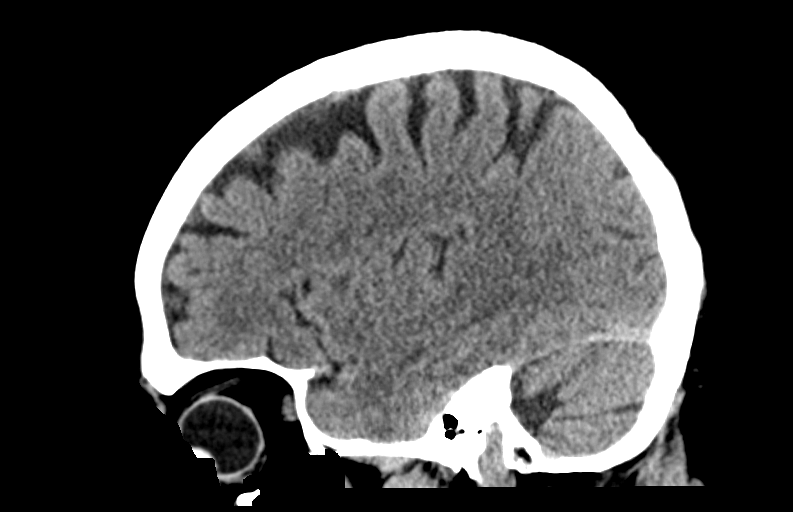
[im 32/63  brain]
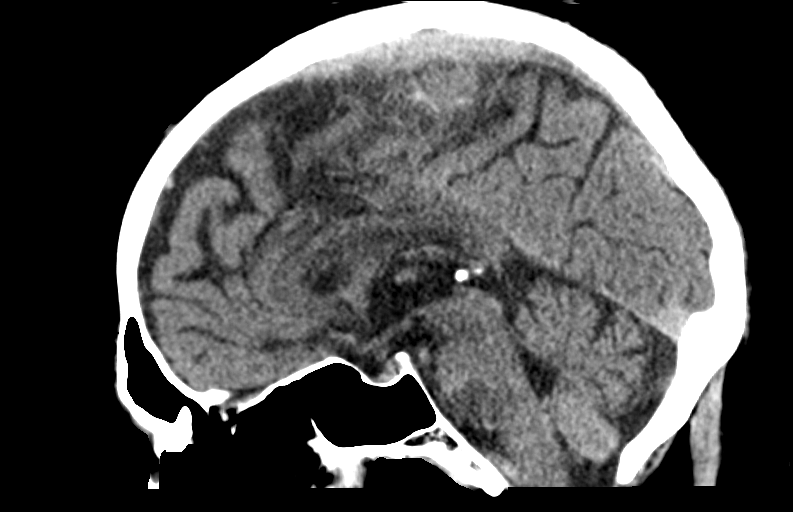
[im 42/63  brain]
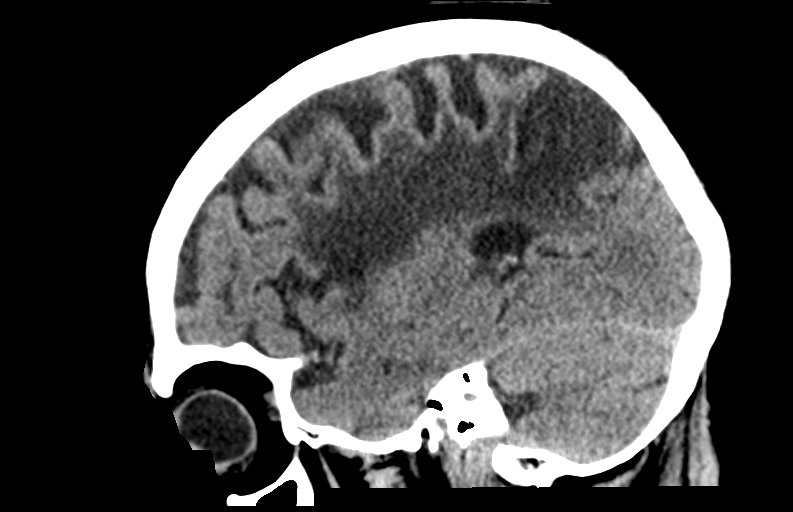

[14 of 47 positions shown; findings below may reference images not displayed]

FINDINGS: Brain: No evidence of acute infarction, hemorrhage, hydrocephalus,
extra-axial collection or mass lesion/mass effect. A remote left
frontoparietal infarct is again identified.

Vascular: Mild intracranial atherosclerotic calcifications noted.

Skull: Normal. Negative for fracture or focal lesion.

Sinuses/Orbits: No acute finding.

Other: None.
IMPRESSION: No evidence of acute intracranial abnormality.

Remote left frontoparietal infarct.
